# Patient Record
Sex: Female | Born: 1940 | Race: White | Hispanic: No | Smoking: Never smoker
Health system: Southern US, Community
[De-identification: ages and names within clinical notes are randomized; demographics above are authoritative.]

## PROBLEM LIST (undated history)

## (undated) DIAGNOSIS — F32A Depression, unspecified: Secondary | ICD-10-CM

## (undated) DIAGNOSIS — I499 Cardiac arrhythmia, unspecified: Secondary | ICD-10-CM

## (undated) DIAGNOSIS — I10 Essential (primary) hypertension: Secondary | ICD-10-CM

## (undated) DIAGNOSIS — F329 Major depressive disorder, single episode, unspecified: Secondary | ICD-10-CM

## (undated) DIAGNOSIS — M797 Fibromyalgia: Secondary | ICD-10-CM

## (undated) DIAGNOSIS — E039 Hypothyroidism, unspecified: Secondary | ICD-10-CM

## (undated) HISTORY — PX: REPLACEMENT TOTAL KNEE: SUR1224

## (undated) HISTORY — PX: BLEPHAROPLASTY: SUR158

## (undated) HISTORY — PX: ABDOMINAL HYSTERECTOMY: SHX81

## (undated) HISTORY — PX: CATARACT EXTRACTION W/ INTRAOCULAR LENS & ANTERIOR VITRECTOMY: SHX1308

## (undated) HISTORY — PX: TONSILLECTOMY: SUR1361

---
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
## 2018-11-02 ENCOUNTER — Other Ambulatory Visit: Payer: Self-pay | Admitting: Student

## 2018-11-02 DIAGNOSIS — M19012 Primary osteoarthritis, left shoulder: Secondary | ICD-10-CM

## 2018-11-07 ENCOUNTER — Other Ambulatory Visit: Payer: Self-pay

## 2018-11-07 ENCOUNTER — Ambulatory Visit
Admission: RE | Admit: 2018-11-07 | Discharge: 2018-11-07 | Disposition: A | Discharge status: Home / Self Care | Payer: Medicare HMO | Source: Ambulatory Visit | Attending: Student | Admitting: Student

## 2018-11-07 DIAGNOSIS — M19012 Primary osteoarthritis, left shoulder: Secondary | ICD-10-CM

## 2018-12-25 ENCOUNTER — Other Ambulatory Visit: Payer: Self-pay

## 2018-12-25 ENCOUNTER — Encounter
Admission: RE | Admit: 2018-12-25 | Discharge: 2018-12-25 | Disposition: A | Discharge status: Home / Self Care | Payer: Medicare HMO | Source: Ambulatory Visit | Attending: Surgery | Admitting: Surgery

## 2018-12-25 DIAGNOSIS — E785 Hyperlipidemia, unspecified: Secondary | ICD-10-CM | POA: Diagnosis not present

## 2018-12-25 DIAGNOSIS — R9431 Abnormal electrocardiogram [ECG] [EKG]: Secondary | ICD-10-CM | POA: Diagnosis not present

## 2018-12-25 DIAGNOSIS — Z01810 Encounter for preprocedural cardiovascular examination: Secondary | ICD-10-CM

## 2018-12-25 DIAGNOSIS — Z01818 Encounter for other preprocedural examination: Secondary | ICD-10-CM | POA: Insufficient documentation

## 2018-12-25 DIAGNOSIS — I491 Atrial premature depolarization: Secondary | ICD-10-CM | POA: Diagnosis not present

## 2018-12-25 HISTORY — DX: Hypothyroidism, unspecified: E03.9

## 2018-12-25 HISTORY — DX: Depression, unspecified: F32.A

## 2018-12-25 HISTORY — DX: Essential (primary) hypertension: I10

## 2018-12-25 HISTORY — DX: Fibromyalgia: M79.7

## 2018-12-25 HISTORY — DX: Cardiac arrhythmia, unspecified: I49.9

## 2018-12-25 LAB — PROTIME-INR
INR: 1.1 (ref 0.8–1.2)
Prothrombin Time: 13.6 seconds (ref 11.4–15.2)

## 2018-12-25 LAB — TYPE AND SCREEN
ABO/RH(D): O POS
Antibody Screen: NEGATIVE

## 2018-12-25 LAB — SURGICAL PCR SCREEN
MRSA, PCR: NEGATIVE
Staphylococcus aureus: NEGATIVE

## 2018-12-25 LAB — BASIC METABOLIC PANEL
Anion gap: 9 (ref 5–15)
BUN: 14 mg/dL (ref 8–23)
CO2: 29 mmol/L (ref 22–32)
Calcium: 9.1 mg/dL (ref 8.9–10.3)
Chloride: 99 mmol/L (ref 98–111)
Creatinine, Ser: 0.72 mg/dL (ref 0.44–1.00)
GFR calc Af Amer: >60 mL/min (ref >60)
GFR calc non Af Amer: >60 mL/min (ref >60)
Glucose, Bld: 113 mg/dL — ABNORMAL HIGH (ref 70–99)
Potassium: 3.8 mmol/L (ref 3.5–5.1)
Sodium: 137 mmol/L (ref 135–145)

## 2018-12-25 LAB — CBC
HCT: 45.2 % (ref 36.0–46.0)
Hemoglobin: 15.3 g/dL — ABNORMAL HIGH (ref 12.0–15.0)
MCH: 31 pg (ref 26.0–34.0)
MCHC: 33.8 g/dL (ref 30.0–36.0)
MCV: 91.7 fL (ref 80.0–100.0)
Platelets: 272 10*3/uL (ref 150–400)
RBC: 4.93 MIL/uL (ref 3.87–5.11)
RDW: 12.1 % (ref 11.5–15.5)
WBC: 10.9 10*3/uL — ABNORMAL HIGH (ref 4.0–10.5)
nRBC: 0 % (ref 0.0–0.2)

## 2018-12-25 LAB — APTT: aPTT: 32 seconds (ref 24–36)

## 2018-12-25 LAB — SEDIMENTATION RATE: Sed Rate: 6 mm/hr (ref 0–30)

## 2018-12-25 NOTE — Patient Instructions (Addendum)
Your procedure is scheduled on: 01/03/2019 Thurs Report to Same Day Surgery 2nd floor medical mall [facility] Entrance-take elevator on left to 2nd floor.  Check in with surgery information desk.) To find out your arrival time please call [phone] between 1PM - 3PM on 01/02/2019 Wed  Remember: Instructions that are not followed completely may result in serious medical risk, up to and including death, or upon the discretion of your surgeon and anesthesiologist your surgery may need to be rescheduled.    _x___ 1. Do not eat food after midnight the night before your procedure. You may drink clear liquids up to 2 hours before you are scheduled to arrive at the hospital for your procedure.  Do not drink clear liquids within 2 hours of your scheduled arrival to the hospital.  Clear liquids include  --Water or Apple juice without pulp  --Clear carbohydrate beverage such as ClearFast or Gatorade  --Black Coffee or Clear Tea (No milk, no creamers, do not add anything to                  the coffee or Tea Type 1 and type 2 diabetics should only drink water.   ____Ensure clear carbohydrate drink on the way to the hospital for bariatric patients  ____Ensure clear carbohydrate drink 3 hours before surgery.   No gum chewing or hard candies.     __x__ 2. No Alcohol for 24 hours before or after surgery.   __x__3. No Smoking or e-cigarettes for 24 prior to surgery.  Do not use any chewable tobacco products for at least 6 hour prior to surgery   ____  4. Bring all medications with you on the day of surgery if instructed.    __x__ 5. Notify your doctor if there is any change in your medical condition     (cold, fever, infections).    x___6. On the morning of surgery brush your teeth with toothpaste and water.  You may rinse your mouth with mouth wash if you wish.  Do not swallow any toothpaste or mouthwash.   Do not wear jewelry, make-up, hairpins, clips or nail polish.  Do not wear lotions,  powders, or perfumes. You may wear deodorant.  Do not shave 48 hours prior to surgery. Men may shave face and neck.  Do not bring valuables to the hospital.    [facility] is not responsible for any belongings or valuables.               Contacts, dentures or bridgework may not be worn into surgery.  Leave your suitcase in the car. After surgery it may be brought to your room.  For patients admitted to the hospital, discharge time is determined by your                       treatment team.  _  Patients discharged the day of surgery will not be allowed to drive home.  You will need someone to drive you home and stay with you the night of your procedure.    Please read over the following fact sheets that you were given:   [facility] Preparing for Surgery and or MRSA Information   _x___ Take anti-hypertensive listed below, cardiac, seizure, asthma,     anti-reflux and psychiatric medicines. These include:  1. levothyroxine (SYNTHROID) 137 MCG tablet  2.metoprolol tartrate (LOPRESSOR) 25 MG tablet  3.atorvastatin (LIPITOR) 20 MG tablet  4.  5.  6.  ____Fleets  enema or Magnesium Citrate as directed.   _x___ Use CHG Soap or sage wipes as directed on instruction sheet   ____ Use inhalers on the day of surgery and bring to hospital day of surgery  ____ Stop Metformin and Janumet 2 days prior to surgery.    ____ Take 1/2 of usual insulin dose the night before surgery and none on the morning     surgery.   _x___ Follow recommendations from Cardiologist, Pulmonologist or PCP regarding          stopping Aspirin, Coumadin, Plavix ,Eliquis, Effient, or Pradaxa, and Pletal.  X____Stop Anti-inflammatories such as Advil, Aleve, Ibuprofen, Motrin, Naproxen, Naprosyn, Goodies powders or aspirin products. OK to take Tylenol and Celebrex.   _x___ Stop supplements until after surgery.  But may continue Vitamin D, Vitamin B,       and multivitamin. Stop vitamin E and krill oil on Thurs  ____ Bring  C-Pap to the hospital.

## 2018-12-27 ENCOUNTER — Other Ambulatory Visit: Payer: Medicare HMO

## 2018-12-27 ENCOUNTER — Inpatient Hospital Stay: Admission: RE | Admit: 2018-12-27 | Payer: Medicare HMO | Source: Ambulatory Visit

## 2018-12-31 ENCOUNTER — Other Ambulatory Visit
Admission: RE | Admit: 2018-12-31 | Discharge: 2018-12-31 | Disposition: A | Discharge status: Home / Self Care | Payer: Medicare HMO | Source: Ambulatory Visit | Attending: Surgery | Admitting: Surgery

## 2018-12-31 ENCOUNTER — Other Ambulatory Visit: Payer: Self-pay

## 2018-12-31 DIAGNOSIS — Z20828 Contact with and (suspected) exposure to other viral communicable diseases: Secondary | ICD-10-CM | POA: Insufficient documentation

## 2018-12-31 DIAGNOSIS — Z01812 Encounter for preprocedural laboratory examination: Secondary | ICD-10-CM | POA: Diagnosis present

## 2018-12-31 LAB — URINALYSIS, COMPLETE (UACMP) WITH MICROSCOPIC
Bacteria, UA: NONE SEEN
Bilirubin Urine: NEGATIVE
Glucose, UA: NEGATIVE mg/dL
Ketones, ur: NEGATIVE mg/dL
Leukocytes,Ua: NEGATIVE
Nitrite: NEGATIVE
Protein, ur: NEGATIVE mg/dL
Specific Gravity, Urine: 1.008 (ref 1.005–1.030)
Squamous Epithelial / HPF: NONE SEEN (ref 0–5)
pH: 7 (ref 5.0–8.0)

## 2018-12-31 LAB — SARS CORONAVIRUS 2 (TAT 6-24 HRS): SARS Coronavirus 2: NEGATIVE

## 2019-01-01 LAB — URINE CULTURE: Culture: NO GROWTH

## 2019-01-03 ENCOUNTER — Inpatient Hospital Stay
Admission: RE | Admit: 2019-01-03 | Discharge: 2019-01-05 | DRG: 483 | Disposition: A | Discharge status: Home Health | Payer: Medicare HMO | Source: Ambulatory Visit | Attending: Surgery | Admitting: Surgery

## 2019-01-03 ENCOUNTER — Inpatient Hospital Stay: Payer: Medicare HMO

## 2019-01-03 ENCOUNTER — Inpatient Hospital Stay: Payer: Medicare HMO | Admitting: Anesthesiology

## 2019-01-03 ENCOUNTER — Encounter
Admission: RE | Disposition: A | Discharge status: Home Health | Payer: Self-pay | Source: Ambulatory Visit | Attending: Surgery

## 2019-01-03 ENCOUNTER — Other Ambulatory Visit: Payer: Self-pay

## 2019-01-03 DIAGNOSIS — M797 Fibromyalgia: Secondary | ICD-10-CM | POA: Diagnosis present

## 2019-01-03 DIAGNOSIS — F329 Major depressive disorder, single episode, unspecified: Secondary | ICD-10-CM | POA: Diagnosis present

## 2019-01-03 DIAGNOSIS — E039 Hypothyroidism, unspecified: Secondary | ICD-10-CM | POA: Diagnosis present

## 2019-01-03 DIAGNOSIS — Z885 Allergy status to narcotic agent status: Secondary | ICD-10-CM | POA: Diagnosis not present

## 2019-01-03 DIAGNOSIS — Z9049 Acquired absence of other specified parts of digestive tract: Secondary | ICD-10-CM | POA: Diagnosis not present

## 2019-01-03 DIAGNOSIS — M19012 Primary osteoarthritis, left shoulder: Secondary | ICD-10-CM | POA: Diagnosis present

## 2019-01-03 DIAGNOSIS — I10 Essential (primary) hypertension: Secondary | ICD-10-CM | POA: Diagnosis present

## 2019-01-03 DIAGNOSIS — Z20828 Contact with and (suspected) exposure to other viral communicable diseases: Secondary | ICD-10-CM | POA: Diagnosis present

## 2019-01-03 DIAGNOSIS — Z96612 Presence of left artificial shoulder joint: Secondary | ICD-10-CM

## 2019-01-03 HISTORY — PX: REVERSE SHOULDER ARTHROPLASTY: SHX5054

## 2019-01-03 LAB — ABO/RH: ABO/RH(D): O POS

## 2019-01-03 SURGERY — ARTHROPLASTY, SHOULDER, TOTAL, REVERSE
Anesthesia: General | Site: Shoulder | Laterality: Left

## 2019-01-03 MED ORDER — DEXAMETHASONE SODIUM PHOSPHATE 10 MG/ML IJ SOLN
INTRAMUSCULAR | Status: AC
  Filled 2019-01-03: qty 1

## 2019-01-03 MED ORDER — ROCURONIUM BROMIDE 50 MG/5ML IV SOLN
INTRAVENOUS | Status: AC
  Filled 2019-01-03: qty 1

## 2019-01-03 MED ORDER — VITAMIN E 180 MG (400 UNIT) PO CAPS
400.0000 [IU] | ORAL_CAPSULE | Freq: Every day | ORAL | Status: DC
  Administered 2019-01-04 – 2019-01-05 (×2): 400 [IU] via ORAL
  Filled 2019-01-03 (×3): qty 1

## 2019-01-03 MED ORDER — SUGAMMADEX SODIUM 200 MG/2ML IV SOLN
INTRAVENOUS | Status: AC
  Filled 2019-01-03: qty 2

## 2019-01-03 MED ORDER — GLYCOPYRROLATE 0.2 MG/ML IJ SOLN
INTRAMUSCULAR | Status: AC
  Filled 2019-01-03: qty 1

## 2019-01-03 MED ORDER — SODIUM CHLORIDE 0.9 % IV SOLN
INTRAVENOUS | Status: DC | PRN
  Administered 2019-01-03: 30 ug/min via INTRAVENOUS

## 2019-01-03 MED ORDER — PROPOFOL 10 MG/ML IV BOLUS
INTRAVENOUS | Status: AC
  Filled 2019-01-03: qty 20

## 2019-01-03 MED ORDER — BUPIVACAINE LIPOSOME 1.3 % IJ SUSP
INTRAMUSCULAR | Status: AC
  Filled 2019-01-03: qty 20

## 2019-01-03 MED ORDER — METOCLOPRAMIDE HCL 5 MG/ML IJ SOLN: 5.0000 mg | Freq: Three times a day (TID) | INTRAMUSCULAR | Status: DC | PRN

## 2019-01-03 MED ORDER — SODIUM CHLORIDE 0.9 % IV SOLN: INTRAVENOUS | Status: DC

## 2019-01-03 MED ORDER — LEVOTHYROXINE SODIUM 137 MCG PO TABS
137.0000 ug | ORAL_TABLET | Freq: Every day | ORAL | Status: DC
  Administered 2019-01-04 – 2019-01-05 (×2): 137 ug via ORAL
  Filled 2019-01-03 (×2): qty 1

## 2019-01-03 MED ORDER — OMEGA-3-ACID ETHYL ESTERS 1 G PO CAPS
1000.0000 mg | ORAL_CAPSULE | Freq: Every day | ORAL | Status: DC
  Administered 2019-01-03 – 2019-01-05 (×3): 1000 mg via ORAL
  Filled 2019-01-03 (×3): qty 1

## 2019-01-03 MED ORDER — BIOTIN W/ VITAMINS C & E 1250-7.5-7.5 MCG-MG-UNT PO CHEW: CHEWABLE_TABLET | Freq: Every day | ORAL | Status: DC

## 2019-01-03 MED ORDER — PHENYLEPHRINE HCL (PRESSORS) 10 MG/ML IV SOLN
INTRAVENOUS | Status: AC
  Filled 2019-01-03: qty 1

## 2019-01-03 MED ORDER — CEFAZOLIN SODIUM-DEXTROSE 2-4 GM/100ML-% IV SOLN
INTRAVENOUS | Status: AC
  Filled 2019-01-03: qty 100

## 2019-01-03 MED ORDER — ACETAMINOPHEN 325 MG PO TABS: 325.0000 mg | ORAL_TABLET | Freq: Four times a day (QID) | ORAL | Status: DC | PRN

## 2019-01-03 MED ORDER — BUPIVACAINE HCL (PF) 0.5 % IJ SOLN
INTRAMUSCULAR | Status: DC | PRN
  Administered 2019-01-03: 10 mL

## 2019-01-03 MED ORDER — PROPOFOL 10 MG/ML IV BOLUS
INTRAVENOUS | Status: DC | PRN
  Administered 2019-01-03: 100 mg via INTRAVENOUS
  Administered 2019-01-03: 50 mg via INTRAVENOUS
  Administered 2019-01-03: 20 mg via INTRAVENOUS

## 2019-01-03 MED ORDER — BUPIVACAINE-EPINEPHRINE (PF) 0.5% -1:200000 IJ SOLN
INTRAMUSCULAR | Status: DC | PRN
  Administered 2019-01-03: 30 mL

## 2019-01-03 MED ORDER — TRANEXAMIC ACID 1000 MG/10ML IV SOLN
INTRAVENOUS | Status: AC
  Filled 2019-01-03: qty 10

## 2019-01-03 MED ORDER — CEFAZOLIN SODIUM-DEXTROSE 2-4 GM/100ML-% IV SOLN
2.0000 g | Freq: Once | INTRAVENOUS | Status: AC
  Administered 2019-01-03: 2 g via INTRAVENOUS

## 2019-01-03 MED ORDER — ROCURONIUM BROMIDE 100 MG/10ML IV SOLN
INTRAVENOUS | Status: DC | PRN
  Administered 2019-01-03: 50 mg via INTRAVENOUS
  Administered 2019-01-03 (×2): 10 mg via INTRAVENOUS

## 2019-01-03 MED ORDER — HYDROMORPHONE HCL 1 MG/ML IJ SOLN: 0.2500 mg | INTRAMUSCULAR | Status: DC | PRN

## 2019-01-03 MED ORDER — FLEET ENEMA 7-19 GM/118ML RE ENEM: 1.0000 | ENEMA | Freq: Once | RECTAL | Status: DC | PRN

## 2019-01-03 MED ORDER — DIPHENHYDRAMINE HCL 12.5 MG/5ML PO ELIX
12.5000 mg | ORAL_SOLUTION | ORAL | Status: DC | PRN
  Administered 2019-01-04: 25 mg via ORAL
  Filled 2019-01-03: qty 10

## 2019-01-03 MED ORDER — DULOXETINE HCL 60 MG PO CPEP
60.0000 mg | ORAL_CAPSULE | Freq: Every day | ORAL | Status: DC
  Administered 2019-01-03 – 2019-01-04 (×2): 60 mg via ORAL
  Filled 2019-01-03 (×3): qty 1

## 2019-01-03 MED ORDER — MIDAZOLAM HCL 2 MG/2ML IJ SOLN
INTRAMUSCULAR | Status: AC
  Administered 2019-01-03: 1 mg via INTRAVENOUS
  Filled 2019-01-03: qty 2

## 2019-01-03 MED ORDER — ONDANSETRON HCL 4 MG/2ML IJ SOLN
INTRAMUSCULAR | Status: DC | PRN
  Administered 2019-01-03: 4 mg via INTRAVENOUS

## 2019-01-03 MED ORDER — LIDOCAINE HCL (PF) 2 % IJ SOLN
INTRAMUSCULAR | Status: AC
  Filled 2019-01-03: qty 10

## 2019-01-03 MED ORDER — ATORVASTATIN CALCIUM 20 MG PO TABS
20.0000 mg | ORAL_TABLET | Freq: Every day | ORAL | Status: DC
  Administered 2019-01-04 – 2019-01-05 (×2): 20 mg via ORAL
  Filled 2019-01-03 (×2): qty 1

## 2019-01-03 MED ORDER — ACETAMINOPHEN 160 MG/5ML PO SOLN
325.0000 mg | ORAL | Status: DC | PRN
  Filled 2019-01-03: qty 20.3

## 2019-01-03 MED ORDER — FENTANYL CITRATE (PF) 100 MCG/2ML IJ SOLN
INTRAMUSCULAR | Status: AC
  Administered 2019-01-03: 09:00:00 50 ug via INTRAVENOUS
  Filled 2019-01-03: qty 2

## 2019-01-03 MED ORDER — METOPROLOL TARTRATE 25 MG PO TABS
25.0000 mg | ORAL_TABLET | Freq: Two times a day (BID) | ORAL | Status: DC
  Administered 2019-01-03 – 2019-01-05 (×4): 25 mg via ORAL
  Filled 2019-01-03 (×4): qty 1

## 2019-01-03 MED ORDER — FENTANYL CITRATE (PF) 100 MCG/2ML IJ SOLN
50.0000 ug | Freq: Once | INTRAMUSCULAR | Status: AC
  Administered 2019-01-03: 50 ug via INTRAVENOUS

## 2019-01-03 MED ORDER — APIXABAN 5 MG PO TABS
5.0000 mg | ORAL_TABLET | Freq: Two times a day (BID) | ORAL | Status: DC
  Administered 2019-01-04 – 2019-01-05 (×3): 5 mg via ORAL
  Filled 2019-01-03 (×3): qty 1

## 2019-01-03 MED ORDER — MIDAZOLAM HCL 2 MG/2ML IJ SOLN
1.0000 mg | Freq: Once | INTRAMUSCULAR | Status: AC
  Administered 2019-01-03: 1 mg via INTRAVENOUS

## 2019-01-03 MED ORDER — FENTANYL CITRATE (PF) 100 MCG/2ML IJ SOLN
INTRAMUSCULAR | Status: DC | PRN
  Administered 2019-01-03 (×2): 50 ug via INTRAVENOUS

## 2019-01-03 MED ORDER — ONDANSETRON HCL 4 MG PO TABS: 4.0000 mg | ORAL_TABLET | Freq: Four times a day (QID) | ORAL | Status: DC | PRN

## 2019-01-03 MED ORDER — BUPIVACAINE-EPINEPHRINE (PF) 0.5% -1:200000 IJ SOLN
INTRAMUSCULAR | Status: AC
  Filled 2019-01-03: qty 30

## 2019-01-03 MED ORDER — ACETAMINOPHEN 325 MG PO TABS: 325.0000 mg | ORAL_TABLET | ORAL | Status: DC | PRN

## 2019-01-03 MED ORDER — MAGNESIUM HYDROXIDE 400 MG/5ML PO SUSP: 30.0000 mL | Freq: Every day | ORAL | Status: DC | PRN

## 2019-01-03 MED ORDER — TRAMADOL HCL 50 MG PO TABS
50.0000 mg | ORAL_TABLET | Freq: Four times a day (QID) | ORAL | Status: DC | PRN
  Administered 2019-01-03 – 2019-01-05 (×3): 50 mg via ORAL
  Filled 2019-01-03 (×3): qty 1

## 2019-01-03 MED ORDER — METOCLOPRAMIDE HCL 10 MG PO TABS: 5.0000 mg | ORAL_TABLET | Freq: Three times a day (TID) | ORAL | Status: DC | PRN

## 2019-01-03 MED ORDER — SODIUM CHLORIDE FLUSH 0.9 % IV SOLN
INTRAVENOUS | Status: AC
  Filled 2019-01-03: qty 40

## 2019-01-03 MED ORDER — FENTANYL CITRATE (PF) 100 MCG/2ML IJ SOLN: 25.0000 ug | INTRAMUSCULAR | Status: DC | PRN

## 2019-01-03 MED ORDER — DOCUSATE SODIUM 100 MG PO CAPS
100.0000 mg | ORAL_CAPSULE | Freq: Two times a day (BID) | ORAL | Status: DC
  Administered 2019-01-03 – 2019-01-05 (×4): 100 mg via ORAL
  Filled 2019-01-03 (×4): qty 1

## 2019-01-03 MED ORDER — EPHEDRINE SULFATE 50 MG/ML IJ SOLN
INTRAMUSCULAR | Status: AC
  Filled 2019-01-03: qty 1

## 2019-01-03 MED ORDER — PHENYLEPHRINE HCL (PRESSORS) 10 MG/ML IV SOLN
INTRAVENOUS | Status: DC | PRN
  Administered 2019-01-03: 100 ug via INTRAVENOUS
  Administered 2019-01-03: 150 ug via INTRAVENOUS

## 2019-01-03 MED ORDER — FENTANYL CITRATE (PF) 100 MCG/2ML IJ SOLN
INTRAMUSCULAR | Status: AC
  Filled 2019-01-03: qty 2

## 2019-01-03 MED ORDER — BUPIVACAINE HCL (PF) 0.5 % IJ SOLN
INTRAMUSCULAR | Status: AC
  Filled 2019-01-03: qty 10

## 2019-01-03 MED ORDER — LIDOCAINE HCL (CARDIAC) PF 100 MG/5ML IV SOSY
PREFILLED_SYRINGE | INTRAVENOUS | Status: DC | PRN
  Administered 2019-01-03: 100 mg via INTRAVENOUS

## 2019-01-03 MED ORDER — PROMETHAZINE HCL 25 MG/ML IJ SOLN: 6.2500 mg | INTRAMUSCULAR | Status: DC | PRN

## 2019-01-03 MED ORDER — TRANEXAMIC ACID 1000 MG/10ML IV SOLN
INTRAVENOUS | Status: DC | PRN
  Administered 2019-01-03: 1000 mg via TOPICAL

## 2019-01-03 MED ORDER — LIDOCAINE HCL (PF) 1 % IJ SOLN
INTRAMUSCULAR | Status: AC
  Filled 2019-01-03: qty 5

## 2019-01-03 MED ORDER — CEFAZOLIN SODIUM-DEXTROSE 2-4 GM/100ML-% IV SOLN
2.0000 g | Freq: Four times a day (QID) | INTRAVENOUS | Status: AC
  Administered 2019-01-03 – 2019-01-04 (×3): 2 g via INTRAVENOUS
  Filled 2019-01-03 (×3): qty 100

## 2019-01-03 MED ORDER — FAMOTIDINE 20 MG PO TABS
20.0000 mg | ORAL_TABLET | Freq: Once | ORAL | Status: AC
  Administered 2019-01-03: 20 mg via ORAL

## 2019-01-03 MED ORDER — DEXAMETHASONE SODIUM PHOSPHATE 10 MG/ML IJ SOLN
INTRAMUSCULAR | Status: DC | PRN
  Administered 2019-01-03: 10 mg via INTRAVENOUS

## 2019-01-03 MED ORDER — ONDANSETRON HCL 4 MG/2ML IJ SOLN: 4.0000 mg | Freq: Four times a day (QID) | INTRAMUSCULAR | Status: DC | PRN

## 2019-01-03 MED ORDER — FAMOTIDINE 20 MG PO TABS
ORAL_TABLET | ORAL | Status: AC
  Filled 2019-01-03: qty 1

## 2019-01-03 MED ORDER — OXYCODONE HCL 5 MG PO TABS
5.0000 mg | ORAL_TABLET | ORAL | Status: DC | PRN
  Administered 2019-01-04: 5 mg via ORAL
  Administered 2019-01-05 (×2): 10 mg via ORAL
  Filled 2019-01-03: qty 1
  Filled 2019-01-03 (×2): qty 2

## 2019-01-03 MED ORDER — LACTATED RINGERS IV SOLN
INTRAVENOUS | Status: DC
  Administered 2019-01-03 (×2): via INTRAVENOUS

## 2019-01-03 MED ORDER — BUPIVACAINE LIPOSOME 1.3 % IJ SUSP
INTRAMUSCULAR | Status: DC | PRN
  Administered 2019-01-03: 20 mL

## 2019-01-03 MED ORDER — EPHEDRINE SULFATE 50 MG/ML IJ SOLN
INTRAMUSCULAR | Status: DC | PRN
  Administered 2019-01-03 (×3): 5 mg via INTRAVENOUS

## 2019-01-03 MED ORDER — MEPERIDINE HCL 50 MG/ML IJ SOLN: 6.2500 mg | INTRAMUSCULAR | Status: DC | PRN

## 2019-01-03 MED ORDER — GLYCOPYRROLATE 0.2 MG/ML IJ SOLN
INTRAMUSCULAR | Status: DC | PRN
  Administered 2019-01-03: 0.1 mg via INTRAVENOUS

## 2019-01-03 MED ORDER — BISACODYL 10 MG RE SUPP
10.0000 mg | Freq: Every day | RECTAL | Status: DC | PRN
  Administered 2019-01-05: 10 mg via RECTAL
  Filled 2019-01-03: qty 1

## 2019-01-03 MED ORDER — SUGAMMADEX SODIUM 200 MG/2ML IV SOLN
INTRAVENOUS | Status: DC | PRN
  Administered 2019-01-03: 200 mg via INTRAVENOUS

## 2019-01-03 MED ORDER — ONDANSETRON HCL 4 MG/2ML IJ SOLN
INTRAMUSCULAR | Status: AC
  Filled 2019-01-03: qty 2

## 2019-01-03 MED ORDER — ACETAMINOPHEN 500 MG PO TABS
1000.0000 mg | ORAL_TABLET | Freq: Four times a day (QID) | ORAL | Status: AC
  Administered 2019-01-03 – 2019-01-04 (×4): 1000 mg via ORAL
  Filled 2019-01-03 (×4): qty 2

## 2019-01-03 SURGICAL SUPPLY — 71 items
BASEPLATE AUG MED W-TAPER (Plate) ×3 IMPLANT
BEARING HUMERAL SHLDER 36M STD (Shoulder) ×1 IMPLANT
BIT DRILL 2.7 W/STOP DISP (BIT) ×2 IMPLANT
BIT DRILL 2.7MM W/STOP DISP (BIT) ×1
BIT DRILL TWIST 2.7 (BIT) ×2 IMPLANT
BIT DRILL TWIST 2.7MM (BIT) ×1
BLADE SAW SAG 25X90X1.19 (BLADE) ×3 IMPLANT
CANISTER SUCT 1200ML W/VALVE (MISCELLANEOUS) ×3 IMPLANT
CANISTER SUCT 3000ML PPV (MISCELLANEOUS) ×6 IMPLANT
CHLORAPREP W/TINT 26 (MISCELLANEOUS) ×3 IMPLANT
COOLER POLAR GLACIER W/PUMP (MISCELLANEOUS) ×3 IMPLANT
COVER BACK TABLE REUSABLE LG (DRAPES) ×3 IMPLANT
COVER WAND RF STERILE (DRAPES) ×3 IMPLANT
CRADLE LAMINECT ARM (MISCELLANEOUS) ×3 IMPLANT
DRAPE 3/4 80X56 (DRAPES) ×6 IMPLANT
DRAPE INCISE IOBAN 66X45 STRL (DRAPES) ×6 IMPLANT
DRAPE SPLIT 6X30 W/TAPE (DRAPES) ×6 IMPLANT
DRSG OPSITE POSTOP 4X8 (GAUZE/BANDAGES/DRESSINGS) ×3 IMPLANT
ELECT BLADE 6.5 EXT (BLADE) IMPLANT
ELECT CAUTERY BLADE 6.4 (BLADE) ×3 IMPLANT
GLENOID SPHERE STD STRL 36MM (Orthopedic Implant) ×3 IMPLANT
GLOVE BIO SURGEON STRL SZ7.5 (GLOVE) ×15 IMPLANT
GLOVE BIO SURGEON STRL SZ8 (GLOVE) ×21 IMPLANT
GLOVE BIOGEL PI IND STRL 8 (GLOVE) ×1 IMPLANT
GLOVE BIOGEL PI INDICATOR 8 (GLOVE) ×2
GLOVE INDICATOR 8.0 STRL GRN (GLOVE) ×3 IMPLANT
GOWN STRL REUS W/ TWL LRG LVL3 (GOWN DISPOSABLE) ×3 IMPLANT
GOWN STRL REUS W/ TWL XL LVL3 (GOWN DISPOSABLE) ×1 IMPLANT
GOWN STRL REUS W/TWL LRG LVL3 (GOWN DISPOSABLE) ×6
GOWN STRL REUS W/TWL XL LVL3 (GOWN DISPOSABLE) ×2
HOOD PEEL AWAY FLYTE STAYCOOL (MISCELLANEOUS) ×12 IMPLANT
KIT STABILIZATION SHOULDER (MISCELLANEOUS) ×3 IMPLANT
KIT TURNOVER KIT A (KITS) ×3 IMPLANT
MASK FACE SPIDER DISP (MASK) ×3 IMPLANT
MAT ABSORB  FLUID 56X50 GRAY (MISCELLANEOUS) ×2
MAT ABSORB FLUID 56X50 GRAY (MISCELLANEOUS) ×1 IMPLANT
NDL SAFETY ECLIPSE 18X1.5 (NEEDLE) ×1 IMPLANT
NEEDLE HYPO 18GX1.5 SHARP (NEEDLE) ×2
NEEDLE HYPO 22GX1.5 SAFETY (NEEDLE) ×3 IMPLANT
NEEDLE SPNL 20GX3.5 QUINCKE YW (NEEDLE) ×3 IMPLANT
NS IRRIG 500ML POUR BTL (IV SOLUTION) ×3 IMPLANT
PACK ARTHROSCOPY SHOULDER (MISCELLANEOUS) ×3 IMPLANT
PAD WRAPON POLAR SHDR UNIV (MISCELLANEOUS) ×1 IMPLANT
PIN HUMERAL STMN 3.2MMX9IN (INSTRUMENTS) ×3 IMPLANT
PULSAVAC PLUS IRRIG FAN TIP (DISPOSABLE) ×3
REAMER GUIDE BUSHING SURG DISP (MISCELLANEOUS) ×3 IMPLANT
REAMER GUIDE W/SCREW AUG (MISCELLANEOUS) ×3 IMPLANT
SCREW BONE STRL 6.5MMX30MM (Screw) ×3 IMPLANT
SCREW LOCKING NS 4.75MMX20MM (Screw) ×3 IMPLANT
SCREW LOCKING STRL 4.75X25X3.5 (Screw) ×3 IMPLANT
SCREW NON-LOCK 4.75MMX15MM (Screw) ×3 IMPLANT
SCREW NON-LOCK 4.75MMX25MMX3.5 (Screw) ×3 IMPLANT
SHOULDER HUMERAL BEAR 36M STD (Shoulder) ×3 IMPLANT
SLING ULTRA II M (MISCELLANEOUS) ×3 IMPLANT
SOL .9 NS 3000ML IRR  AL (IV SOLUTION) ×2
SOL .9 NS 3000ML IRR UROMATIC (IV SOLUTION) ×1 IMPLANT
SPONGE LAP 18X18 RF (DISPOSABLE) ×3 IMPLANT
STAPLER SKIN PROX 35W (STAPLE) ×3 IMPLANT
STEM HUMERAL STRL 11MMX55MM (Stem) ×3 IMPLANT
SUT ETHIBOND 0 MO6 C/R (SUTURE) ×3 IMPLANT
SUT FIBERWIRE #2 38 BLUE 1/2 (SUTURE) ×12
SUT VIC AB 0 CT1 36 (SUTURE) ×3 IMPLANT
SUT VIC AB 2-0 CT1 (SUTURE) ×3 IMPLANT
SUT VIC AB 2-0 CT1 27 (SUTURE) ×4
SUT VIC AB 2-0 CT1 TAPERPNT 27 (SUTURE) ×2 IMPLANT
SUTURE FIBERWR #2 38 BLUE 1/2 (SUTURE) ×4 IMPLANT
SYR 10ML LL (SYRINGE) ×3 IMPLANT
SYR 30ML LL (SYRINGE) IMPLANT
TIP FAN IRRIG PULSAVAC PLUS (DISPOSABLE) ×1 IMPLANT
TRAY HUM MINI SHOULDER +0 40D (Shoulder) ×3 IMPLANT
WRAPON POLAR PAD SHDR UNIV (MISCELLANEOUS) ×3

## 2019-01-03 NOTE — Transfer of Care (Signed)
Immediate Anesthesia Transfer of Care Note  Patient: Julie Wade  Procedure(s) Performed: REVERSE SHOULDER ARTHROPLASTY - RNFA (Left Shoulder)  Patient Location: PACU  Anesthesia Type:General  Level of Consciousness: awake  Airway & Oxygen Therapy: Patient Spontanous Breathing and Patient connected to nasal cannula oxygen  Post-op Assessment: Report given to RN and Post -op Vital signs reviewed and stable  Post vital signs: stable  Last Vitals:  Vitals Value Taken Time  BP 153/73 01/03/19 1357  Temp 36.4 C 01/03/19 1357  Pulse 79 01/03/19 1402  Resp 16 01/03/19 1402  SpO2 98 % 01/03/19 1402  Vitals shown include unvalidated device data.  Last Pain:  Vitals:   01/03/19 1357  TempSrc:   PainSc: (P) 0-No pain         Complications: No apparent anesthesia complications

## 2019-01-03 NOTE — Progress Notes (Addendum)
[place], [phone], which is the patient's contact. No answer. Voice mailbox full. Patient states she has cell phone and will contact him once in her inpatient room.

## 2019-01-03 NOTE — Op Note (Signed)
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  Post-Op Diagnosis:   Same  Procedure:   Reverse left total shoulder arthroplasty.  Surgeon:   Maryagnes AmosJ. Jeffrey Guillermo Difrancesco, MD  Assistant:   Horris LatinoLance McGhee, PA-C; Lindell SparHailey Einck, PA-S  Anesthesia:   General endotracheal with an interscalene block using Exparel placed preoperatively by the anesthesiologist.  Findings:   As above.  Complications:   None  EBL:    200 cc  Fluids:   1000 cc crystalloid  UOP:   None  TT:   None  Drains:   None  Closure:   Staples  Implants:   All press-fit Biomet Comprehensive system with a #11 micro-humeral stem, a 40 mm humeral tray with a standard insert, and a medium augmented mini-base plate with a 36 mm glenosphere.  Brief Clinical Note:   The patient is a 78 year old female with a long history of gradually worsening left shoulder pain. Her symptoms have progressed despite medications, activity modification, etc. Her history and examination consistent with advanced degenerative joint disease as confirmed on plain radiographs. An MRI scan demonstrated moderate tendinopathy with partial-thickness tearing of the supraspinatus tendon. The patient presents at this time for a reverse left total shoulder arthroplasty.  Procedure:   The patient underwent placement of an interscalene block using Exparel by the anesthesiologist in the preoperative holding area before being brought into the operating room and lain in the supine position. The patient then underwent general endotracheal intubation and anesthesia before the patient was repositioned in the beach chair position using the beach chair positioner. The left shoulder and upper extremity were prepped with ChloraPrep solution before being draped sterilely. Preoperative antibiotics were administered. A standard anterior approach to the shoulder  was made through an approximately 4-5 inch incision. The incision was carried down through the subcutaneous tissues to expose the deltopectoral fascia. The interval between the deltoid and pectoralis muscles was identified and this plane developed, retracting the cephalic vein laterally with the deltoid muscle. The conjoined tendon was identified. Its lateral margin was dissected and the Kolbel self-retraining retractor inserted. The "three sisters" were identified and cauterized. Bursal tissues were removed to improve visualization. The subscapularis tendon was released from its attachment to the lesser tuberosity 1 cm proximal to its insertion and several tagging sutures placed. The inferior capsule was released with care after identifying and protecting the axillary nerve. The proximal humeral cut was made at approximately 25 of retroversion using the extra-medullary guide.   Attention was redirected to the glenoid. The labrum was debrided circumferentially before the center of the glenoid was marked with electrocautery. The guidewire was drilled into the glenoid neck using the appropriate guide. After verifying its position, it was overreamed with the mini-baseplate reamer to create a flat surface over greater than 50% of the glenoid. The medium trial was positioned and this was felt to be the optimal amount of augment for the baseplate. The guidepin hole was created before the secondary reaming post was positioned and temporarily secured using a threaded screw. The secondary reaming was performed. The permanent medium augmented mini-baseplate was impacted into place. It was stabilized with a 25 x 6.5 mm central screw and four peripheral screws. Locking screws were placed superiorly and inferiorly while nonlocking screws were placed anteriorly and posteriorly. The permanent 36 mm glenosphere was then impacted into place and its Morse taper locking mechanism verified using  manual distraction.  Attention was  directed to the humeral side. The humeral canal was reamed sequentially beginning with the end-cutting reamer then progressing from a 4 mm reamer up to an 11 mm reamer. This provided excellent circumferential chatter. The canal was broached beginning with a #8 broach and progressing to a #11 broach. This was left in place and a trial reduction performed using the standard trial humeral platform. The arm demonstrated excellent range of motion as the hand could be brought across the chest to the opposite shoulder and brought to the top of the patient's head and to the patient's ear. The shoulder appeared stable throughout this range of motion. The joint was dislocated and the trial components removed. The permanent #11 micro-stem was impacted into place with care taken to maintain the appropriate version. The permanent 40 mm humeral platform with the standard insert was put together on the back table and impacted into place. Again, the [facility] taper locking mechanism was verified using manual distraction. The shoulder was relocated using two finger pressure and again placed through a range of motion with the findings as described above.  The wound was copiously irrigated with bacitracin saline solution using the jet lavage system before a total of 30 cc of 0.5% Sensorcaine with epinephrine was injected into the pericapsular and peri-incisional tissues to help with postoperative analgesia. The subscapularis tendon was reapproximated using #2 FiberWire interrupted sutures. The deltopectoral interval was closed using #0 Vicryl interrupted sutures before the subcutaneous tissues were closed using 2-0 Vicryl interrupted sutures. The skin was closed using staples. Prior to closing the skin, 1 g of transexemic acid in 10 cc of normal saline was injected intra-articularly to help with postoperative bleeding. A sterile occlusive dressing was applied to the wound before the arm was placed into a shoulder immobilizer with an  abduction pillow. A Polar Care system also was applied to the shoulder. The patient was then transferred back to a hospital bed before being awakened, extubated, and returned to the recovery room in satisfactory condition after tolerating the procedure well.

## 2019-01-03 NOTE — Progress Notes (Signed)
PHARMACIST - PHYSICIAN ORDER COMMUNICATION  CONCERNING: P&T Medication Policy on Herbal Medications  DESCRIPTION:  This patient's order for:  Biotin with Vit C and E   has been noted.  This product(s) is classified as an "herbal" or natural product. Due to a lack of definitive safety studies or FDA approval, nonstandard manufacturing practices, plus the potential risk of unknown drug-drug interactions while on inpatient medications, the Pharmacy and Therapeutics Committee does not permit the use of "herbal" or natural products of this type within [facility].   ACTION TAKEN: The pharmacy department is unable to verify this order at this time and your patient has been informed of this safety policy. Please reevaluate patient's clinical condition at discharge and address if the herbal or natural product(s) should be resumed at that time.

## 2019-01-03 NOTE — Evaluation (Signed)
Physical Therapy Evaluation Patient Details Name: Julie Wade MRN: [id number] DOB: 09-23-1940 Today's Date: 01/03/2019   History of Present Illness  78 y/o female s/p L reverse total shoulder 01/03/19.  Clinical Impression  Pt did well with PT exam and showed ability to ambulate with good safety, speed and confidence around the nurses' station.  Reviewed HEP, precautions and expected course of recovery.  Pt will have 24/7 assist available when she returns home, safe to do so from PT perspective.      Follow Up Recommendations Follow surgeon's recommendation for DC plan and follow-up therapies    Equipment Recommendations  None recommended by PT    Recommendations for Other Services       Precautions / Restrictions Precautions Precautions: Shoulder Precaution Booklet Issued: Yes (comment) Required Braces or Orthoses: Sling Restrictions Weight Bearing Restrictions: Yes LUE Weight Bearing: Non weight bearing      Mobility  Bed Mobility Overal bed mobility: Modified Independent             General bed mobility comments: minimal struggle to get to EOB, but able to do so w/o direct assist  Transfers Overall transfer level: Modified independent Equipment used: None             General transfer comment: Pt able to rise to standing with relative ease, mild stagger step initially but no LOBs  Ambulation/Gait Ambulation/Gait assistance: Supervision Gait Distance (Feet): 200 Feet Assistive device: None       General Gait Details: Pt walked with consistent cadence and speed, good confidence and no LOBs or overt safety issues.  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, stays active and independent     Hand Dominance        Extremity/Trunk Assessment   Upper Extremity Assessment Upper Extremity Assessment: (L in sling, R shld elevation <100, functional in limited ROM)    Lower Extremity Assessment Lower Extremity Assessment: Overall WFL for tasks assessed       Communication   Communication: No difficulties  Cognition Arousal/Alertness: Awake/alert Behavior During Therapy: WFL for tasks assessed/performed Overall Cognitive Status: Within Functional Limits for tasks assessed                                        General Comments      Exercises     Assessment/Plan    PT Assessment Patient needs continued PT services  PT Problem List Decreased strength;Decreased activity tolerance;Decreased range of motion;Decreased balance;Pain;Decreased knowledge of use of DME;Decreased coordination;Decreased mobility;Decreased cognition;Decreased safety awareness  PT Treatment Interventions Gait training;Functional mobility training;Therapeutic activities;Therapeutic exercise;Neuromuscular re-education;Balance training;Patient/family education    PT Goals (Current goals can be found in the Care Plan section)  Acute Rehab PT Goals Patient Stated Goal: go home PT Goal Formulation: With patient Time For Goal Achievement: 01/17/19 Potential to Achieve Goals: Good    Frequency BID   Barriers to discharge        Co-evaluation               AM-PAC PT "6 Clicks" Mobility  Outcome Measure Help needed turning from your back to your side while in a flat bed without using bedrails?: None Help  needed moving from lying on your back to sitting on the side of a flat bed without using bedrails?: None Help needed moving to and from a bed to a chair (including a wheelchair)?: None Help needed standing up from a chair using your arms (e.g., wheelchair or bedside chair)?: None Help needed to walk in hospital room?: None Help needed climbing 3-5 steps with a railing? : None 6 Click Score: 24    End of Session Equipment Utilized During Treatment: Gait belt Activity Tolerance: Patient tolerated treatment well Patient left: with call bell/phone within reach;with chair alarm set Nurse Communication: Mobility status PT Visit Diagnosis: Muscle weakness (generalized) (M62.81);Pain Pain - Right/Left: Left Pain - part of body: Shoulder    Time: [id number] PT Time Calculation (min) (ACUTE ONLY): 25 min   Charges:   PT Evaluation $PT Eval Low Complexity: 1 Low          Malachi ProGalen R Birdie Beveridge, DPT 01/03/2019, 6:01 PM

## 2019-01-03 NOTE — H&P (Signed)
Paper H&P to be scanned into permanent record. H&P reviewed and patient re-examined. No changes. 

## 2019-01-03 NOTE — Anesthesia Procedure Notes (Signed)
Anesthesia Regional Block: Interscalene brachial plexus block   Pre-Anesthetic Checklist: ,, timeout performed, Correct Patient, Correct Site, Correct Laterality, Correct Procedure, Correct Position, site marked, Risks and benefits discussed,  Surgical consent,  Pre-op evaluation,  At surgeon's request and post-op pain management  Laterality: Left  Prep: chloraprep       Needles:  Injection technique: Single-shot  Needle Type: Stimiplex     Needle Length: 10cm  Needle Gauge: 21   Needle insertion depth: 5 cm   Additional Needles:   Procedures: Doppler guided,,,, ultrasound used (permanent image in chart),,,,  Motor weakness within 8 minutes.  Narrative:  Start time: 01/03/2019 9:13 AM End time: 01/03/2019 9:25 AM Injection made incrementally with aspirations every 5 mL.  Performed by: Personally  Anesthesiologist: Alphonsus Sias, MD  Additional Notes: Functioning IV was confirmed, O2  and monitors were applied, mild sedation.  A 15mm 21ga Echoplex needle was used. Sterile prep and drape,hand hygiene and sterile gloves were used.  Negative aspiration and negative test dose prior to incremental administration of local anesthetic. Image stored on PACS. The patient tolerated the procedure well.

## 2019-01-03 NOTE — Anesthesia Procedure Notes (Signed)
Procedure Name: Intubation Date/Time: 01/03/2019 11:27 AM Performed by: Lavone Orn, CRNA Pre-anesthesia Checklist: Patient identified, Emergency Drugs available, Suction available, Patient being monitored and Timeout performed Patient Re-evaluated:Patient Re-evaluated prior to induction Oxygen Delivery Method: Circle system utilized Preoxygenation: Pre-oxygenation with 100% oxygen Induction Type: IV induction Ventilation: Mask ventilation without difficulty Laryngoscope Size: Mac and 3 Grade View: Grade I Tube type: Oral Tube size: 7.0 mm Number of attempts: 1 Airway Equipment and Method: Stylet Placement Confirmation: ETT inserted through vocal cords under direct vision,  positive ETCO2 and breath sounds checked- equal and bilateral Secured at: 20 cm Tube secured with: Tape Dental Injury: Teeth and Oropharynx as per pre-operative assessment

## 2019-01-03 NOTE — Anesthesia Preprocedure Evaluation (Addendum)
Anesthesia Evaluation  Patient identified by MRN, date of birth, ID band Patient awake    Reviewed: Allergy & Precautions, H&P , NPO status , reviewed documented beta blocker date and time   Airway Mallampati: III  TM Distance: >3 FB Neck ROM: full    Dental  (+) Upper Dentures, Missing, Partial Lower   Pulmonary    Pulmonary exam normal        Cardiovascular hypertension, + dysrhythmias Atrial Fibrillation  Rhythm:irregular  Hx of Afib, conversion to SR w PACs   Neuro/Psych PSYCHIATRIC DISORDERS Depression  Neuromuscular disease    GI/Hepatic   Endo/Other  Hypothyroidism   Renal/GU      Musculoskeletal  (+) Arthritis , Fibromyalgia -  Abdominal   Peds  Hematology   Anesthesia Other Findings Past Medical History: No date: Depression No date: Dysrhythmia     Comment:  atrial fib. No date: Fibromyalgia No date: Hypertension No date: Hypothyroidism Past Surgical History: No date: ABDOMINAL HYSTERECTOMY No date: BLEPHAROPLASTY No date: CATARACT EXTRACTION W/ INTRAOCULAR LENS & ANTERIOR VITRECTOMY No date: REPLACEMENT TOTAL KNEE; Right No date: TONSILLECTOMY   Reproductive/Obstetrics                            Anesthesia Physical Anesthesia Plan  ASA: III  Anesthesia Plan: General   Post-op Pain Management:  Regional for Post-op pain   Induction: Intravenous  PONV Risk Score and Plan: Ondansetron, Treatment may vary due to age or medical condition and Midazolam  Airway Management Planned: Oral ETT  Additional Equipment:   Intra-op Plan:   Post-operative Plan: Extubation in OR  Informed Consent: I have reviewed the patients History and Physical, chart, labs and discussed the procedure including the risks, benefits and alternatives for the proposed anesthesia with the patient or authorized representative who has indicated his/her understanding and acceptance.     Dental  Advisory Given  Plan Discussed with: CRNA  Anesthesia Plan Comments:         Anesthesia Quick Evaluation

## 2019-01-03 NOTE — Anesthesia Post-op Follow-up Note (Signed)
Anesthesia QCDR form completed.        

## 2019-01-04 LAB — BASIC METABOLIC PANEL
Anion gap: 7 (ref 5–15)
BUN: 15 mg/dL (ref 8–23)
CO2: 28 mmol/L (ref 22–32)
Calcium: 8.8 mg/dL — ABNORMAL LOW (ref 8.9–10.3)
Chloride: 99 mmol/L (ref 98–111)
Creatinine, Ser: 0.63 mg/dL (ref 0.44–1.00)
GFR calc Af Amer: >60 mL/min (ref >60)
GFR calc non Af Amer: >60 mL/min (ref >60)
Glucose, Bld: 147 mg/dL — ABNORMAL HIGH (ref 70–99)
Potassium: 4.6 mmol/L (ref 3.5–5.1)
Sodium: 134 mmol/L — ABNORMAL LOW (ref 135–145)

## 2019-01-04 LAB — CBC WITH DIFFERENTIAL/PLATELET
Abs Immature Granulocytes: 0.04 10*3/uL (ref 0.00–0.07)
Basophils Absolute: 0 10*3/uL (ref 0.0–0.1)
Basophils Relative: 0 %
Eosinophils Absolute: 0 10*3/uL (ref 0.0–0.5)
Eosinophils Relative: 0 %
HCT: 37.6 % (ref 36.0–46.0)
Hemoglobin: 12.6 g/dL (ref 12.0–15.0)
Immature Granulocytes: 0 %
Lymphocytes Relative: 10 %
Lymphs Abs: 0.9 10*3/uL (ref 0.7–4.0)
MCH: 30.9 pg (ref 26.0–34.0)
MCHC: 33.5 g/dL (ref 30.0–36.0)
MCV: 92.2 fL (ref 80.0–100.0)
Monocytes Absolute: 0.8 10*3/uL (ref 0.1–1.0)
Monocytes Relative: 9 %
Neutro Abs: 7.2 10*3/uL (ref 1.7–7.7)
Neutrophils Relative %: 81 %
Platelets: 206 10*3/uL (ref 150–400)
RBC: 4.08 MIL/uL (ref 3.87–5.11)
RDW: 12 % (ref 11.5–15.5)
WBC: 8.9 10*3/uL (ref 4.0–10.5)
nRBC: 0 % (ref 0.0–0.2)

## 2019-01-04 MED ORDER — DIPHENHYDRAMINE HCL 25 MG PO CAPS: 25.0000 mg | ORAL_CAPSULE | Freq: Every evening | ORAL | Status: DC | PRN

## 2019-01-04 NOTE — Evaluation (Signed)
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   Clinical Impression   [name] was seen for an OT evaluation this date. Pt lives with her husband in a 1 level home with a level entry. 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 Pt states her husband regularly provides her with assistance for bathing and dressing due to decreased ROM in BUE. Pt plans to stay with family to assist at home while recovering. Pt has orders for LUE (non-dominant) to be immobilized and will be NWBing per MD. Patient presents with impaired strength/ROM, pain, and sensation to LUE with block not completely resolved yet. These impairments result in a decreased ability to perform self care tasks requiring mod assist for UB/LB dressing and bathing and max assist for application of polar care, compression stockings, and sling/immobilizer. Pt instructed in polar care mgt, compression stockings mgt, sling/immobilizer mgt, ROM exercises for LUE (with instructions for no shoulder exercises until full sensation has returned), LUE precautions, adaptive strategies for bathing/dressing/toileting/grooming, positioning and considerations for sleep, and home/routines modifications to maximize falls prevention, safety, and independence. Handout provided.   Pt anxious and at times impulsive during session requiring increased education on safety awareness and falls prevention. OT adjusted sling/immobilizer and polar care to improve comfort, optimize positioning, and to maximize skin integrity/safety. Upon removing sling/immobilizer, OT noted asymmetry between pt R and L glenohumeral joints even with prompts to relax B UE. LUE was noted to be swollen around the acromion process. Pt also noted to have bruising on axillary side of left underarm. OT requested RN come to  assess. RN stated asymmetry likely surgical swelling and cleared pt to continue with therapy. Pt verbalized minimal understanding of all education/training provided, and requested review with her husband on next date to ensure carryover to home. Pt will benefit from skilled OT services to address these limitations and improve independence in daily tasks. Recommend HHOT services to continue therapy to maximize return to PLOF, address home/routines modifications and safety, minimize falls risk, and minimize caregiver burden.       Follow Up Recommendations  Home health OT    Equipment Recommendations  Tub/shower seat    Recommendations for Other Services       Precautions / Restrictions Precautions Precautions: Shoulder Shoulder Interventions: Shoulder sling/immobilizer Precaution Booklet Issued: No Restrictions Weight Bearing Restrictions: Yes LUE Weight Bearing: Non weight bearing      Mobility Bed Mobility Overal bed mobility: Needs Assistance Bed Mobility: Supine to Sit;Sit to Supine     Supine to sit: HOB elevated;Mod assist Sit to supine: Modified independent (Device/Increase time)   General bed mobility comments: Pt demonstrated significant lack of safety awareness during semi-sup to sit on this date. Attempted modified log-roll OOB with little regard for body or LUE positioning. Required Mod assist to come fully EOB. Pt stated she was having more difficulty due to being pain limited in her RUE from increased use. Extensive education in falls prevention strategies and safe bed mobility technique. Mod I for sit>sup.  Transfers Overall transfer level: Modified independent Equipment used: None             General transfer comment: easily rises to standing w/o assist    Balance Overall balance assessment: Independent;History of Falls  ADL either performed or assessed with clinical judgement   ADL Overall ADL's  : Needs assistance/impaired                                       General ADL Comments: Pt requires max-total assist to don sling/polar care on this date. Requires consistent cueing for safety as well as re-direction to tasks. Assessment of UB dressing limited. RN request pt not dress herself until ready to DC. Min assist for LB dressing (to pull up pants in back) and supervision for functional mobility.     Vision         Perception     Praxis      Pertinent Vitals/Pain Pain Assessment: 0-10 Pain Score: 5  Pain Location: RIGHT shoulder. LUE block still in place. Pain Intervention(s): Limited activity within patient's tolerance;Monitored during session;Repositioned     Hand Dominance Right   Extremity/Trunk Assessment Upper Extremity Assessment Upper Extremity Assessment: RUE deficits/detail;LUE deficits/detail RUE Deficits / Details: Decreased active shoulder ROM unable to flex shoulder above ~100. Pt requires assistance at baseline to complete ADL due to BUE limitations. [facility] W[facility]. RUE: Unable to fully assess due to pain RUE Coordination: decreased gross motor LUE Deficits / Details: S/p Rev. TSA. LUE in sling/immob NWB. LUE: Unable to fully assess due to immobilization LUE Coordination: decreased gross motor;decreased fine motor   Lower Extremity Assessment Lower Extremity Assessment: Overall WFL for tasks assessed;Defer to PT evaluation   Cervical / Trunk Assessment Cervical / Trunk Assessment: Normal   Communication Communication Communication: No difficulties   Cognition Arousal/Alertness: Awake/alert Behavior During Therapy: WFL for tasks assessed/performed;Anxious;Impulsive Overall Cognitive Status: Within Functional Limits for tasks assessed                                 General Comments: Pt had difficulty adhereing to shoulder precautions on this date. Required consistent cueing for safety and to re-direct to task t/o session.  Tends to move very quickly and at times impulsively. Needs reminders to slow down.   General Comments  Pt noted to have some asymmetry between L and R shoulders on this date. Pt has a significantly difficult time relaxing LUE and tends to keep it elevated and guarded despite block still being in place with 0/10 pain. This Thereasa Parkinauthor also noted bruising around pt LUE underarm. RN in room to assess at this therapists request. RN stated asymmetry likely due to swelling from sx and brusing from pt "fragile skin".  RN did not endorse further concerns or instruct this therapist in any additional precautions. Polar care re-applied with #2 strap left off for pt comfort 2/2 underarm brusing. OT will continue to monitor and provided pt education on shoulder precautions.    Exercises Exercises: Other exercises Other Exercises Other Exercises: performed PROM activites for R shoulder flexion and abd to 90, ER to neutral (15 reps each on L) all w/o pain or structural limitations.  Reviewed elbow, wrist, hand HEP. Other Exercises: Pt educated in falls prevention strategies, shoulder precautions, LUE ROM exercises with strict instructions not to engage in shoulder ROM until block has fully worn off, safe bed mobility techniques, sling/polar care mgt. Pt would benefit from further education in all areas mentioned.   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, stays active and independent        OT Problem List: Decreased strength;Decreased coordination;Pain;Decreased range of  motion;Decreased safety awareness;Decreased activity tolerance;Decreased knowledge of use of DME or AE;Decreased knowledge of precautions;Impaired UE functional use      OT Treatment/Interventions: Self-care/ADL training;Therapeutic exercise;Therapeutic activities;DME and/or AE instruction;Patient/family education;Balance training;Modalities    OT Goals(Current goals can be found in the care plan section) Acute Rehab OT Goals Patient Stated Goal: go home OT Goal Formulation: With patient Time For Goal Achievement: 01/18/19 Potential to Achieve Goals: Good ADL Goals Pt Will Perform Upper Body Bathing: sitting;with min assist;with caregiver independent in assisting;with adaptive equipment(With LRAD PRN for improved safety and functional independence) Pt Will Perform Upper Body Dressing: sitting;with adaptive equipment;with min assist;with caregiver independent in assisting(With LRAD PRN for improved safety and functional independence) Additional ADL Goal #1: Pt will independently verbalize a plan to implement at least 3 learned falls preventions strategies into her daily routine upon hospita DC for improved safety and functional independence during meaningful occupations of daily life.  OT Frequency: Min 1X/week   Barriers to D/C:            Co-evaluation              AM-PAC OT "6 Clicks" Daily Activity     Outcome Measure Help from another person eating meals?: None Help from another person taking care of personal grooming?: A Little Help from another person toileting, which includes using toliet, bedpan, or urinal?: A Little Help from another person bathing (including washing, rinsing, drying)?: A Lot Help from another person to put on and taking off regular upper body clothing?: A Lot Help from another person to put on and taking off regular lower body clothing?: A Little 6 Click Score: 17   End of Session Equipment Utilized During Treatment: Gait belt Nurse Communication:  Other (comment)(Pt back to bed and requesting to be allowed to sleep.)  Activity Tolerance: Patient tolerated treatment well Patient left: in bed;with call bell/phone within reach;with bed alarm set;with SCD's reapplied(With sling and polar care in place.)  OT Visit Diagnosis: Other abnormalities of gait and mobility (R26.89);History of falling (Z91.81);Pain Pain - Right/Left: Right(non-operative) Pain - part of body: Shoulder                Time: [id number] OT Time Calculation (min): 43 min Charges:  OT General Charges $OT Visit: 1 Visit OT Evaluation $OT Eval Moderate Complexity: 1 Mod OT Treatments $Self Care/Home Management : 38-52 mins  Rockney GheeSerenity Jaber Dunlow, M.S., OTR/L Ascom: [phone] 01/04/19, 3:52 PM

## 2019-01-04 NOTE — Progress Notes (Signed)
Physical Therapy Treatment Patient Details Name: Julie Wade MRN: [id number] DOB: 01/31/1941 Today's Date: 01/04/2019    History of Present Illness 78 y/o female s/p L reverse total shoulder 01/03/19.    PT Comments    Pt generally did very well, confident with ambulation and mobility.  Pt largely able to get pants on independently only needed assist in set up as tag was on side and not back of pants and got her confused.  She was able to negotiate up/down steps w/o issue and though she reports feeling somewhat hyper (meds?) and had some impulsivity she ultimately was safe with mobility/ambulation/etc.  She tolerated PROM exercises for the L shoulder to allowable ranges w/o issue or structural resistance.  Pt is safe to return home from PT stand-point.  Follow Up Recommendations  Follow surgeon's recommendation for DC plan and follow-up therapies     Equipment Recommendations  None recommended by PT    Recommendations for Other Services       Precautions / Restrictions Precautions Precautions: Shoulder Shoulder Interventions: Shoulder sling/immobilizer Restrictions LUE Weight Bearing: Non weight bearing    Mobility  Bed Mobility Overal bed mobility: Modified Independent             General bed mobility comments: confidently gets back into bed post ambulation, no assist needed  Transfers Overall transfer level: Modified independent Equipment used: None             General transfer comment: easily rises to standing w/o assist  Ambulation/Gait Ambulation/Gait assistance: Modified independent (Device/Increase time) Gait Distance (Feet): 250 Feet Assistive device: None       General Gait Details: Pt walked with consistent cadence and speed, good confidence and no LOBs or overt safety issues.  O2 remained in the high 90s, HR did increase to 110s   Stairs Stairs: Yes Stairs assistance: Modified independent (Device/Increase time) Stair Management: One rail  Right Number of Stairs: 4 General stair comments: Pt is able to negotiate up/down steps w/o assist and w/o hesitation or safety issues   Wheelchair Mobility    Modified Rankin (Stroke Patients Only)       Balance Overall balance assessment: Independent                                          Cognition Arousal/Alertness: Awake/alert Behavior During Therapy: WFL for tasks assessed/performed Overall Cognitive Status: Within Functional Limits for tasks assessed                                        Exercises Other Exercises Other Exercises: performed PROM activites for R shoulder flexion and abd to 90, ER to neutral (15 reps each on L) all w/o pain or structural limitations.  Reviewed elbow, wrist, hand HEP.    General Comments        Pertinent Vitals/Pain Pain Assessment: No/denies pain(shoulder still numb)    Home Living                      Prior Function            PT Goals (current goals can now be found in the care plan section) Progress towards PT goals: Progressing toward goals    Frequency    7X/week      PT  Plan Frequency needs to be updated    Co-evaluation              AM-PAC PT "6 Clicks" Mobility   Outcome Measure  Help needed turning from your back to your side while in a flat bed without using bedrails?: None Help needed moving from lying on your back to sitting on the side of a flat bed without using bedrails?: None Help needed moving to and from a bed to a chair (including a wheelchair)?: None Help needed standing up from a chair using your arms (e.g., wheelchair or bedside chair)?: None Help needed to walk in hospital room?: None Help needed climbing 3-5 steps with a railing? : None 6 Click Score: 24    End of Session Equipment Utilized During Treatment: Gait belt Activity Tolerance: Patient tolerated treatment well Patient left: with call bell/phone within reach;with chair alarm  set Nurse Communication: Mobility status PT Visit Diagnosis: Muscle weakness (generalized) (M62.81);Pain Pain - Right/Left: Left Pain - part of body: Shoulder     Time: 4562-5638 PT Time Calculation (min) (ACUTE ONLY): 29 min  Charges:  $Gait Training: 8-22 mins $Therapeutic Exercise: 8-22 mins                     Kreg Shropshire, DPT 01/04/2019, 1:29 PM

## 2019-01-04 NOTE — Progress Notes (Signed)
OT Cancellation Note  Patient Details Name: Julie Wade MRN: [id number] DOB: 10-27-1940   Cancelled Treatment:    Reason Eval/Treat Not Completed: Fatigue/lethargy limiting ability to participate;Patient declined, no reason specified. Thank you for the OT consult. Order received and chart reviewed. Upon arrival to pt room, pt sleeping in bed. Wakes to verbal cues. Politely requests OT return at a later time so she can get "another hour of sleep". Will re-attempt at a later time this date, as available.   Shara Blazing, M.S., OTR/L Ascom: [phone] 01/04/19, 8:41 AM

## 2019-01-04 NOTE — Anesthesia Postprocedure Evaluation (Signed)
Anesthesia Post Note  Patient: Julie Wade  Procedure(s) Performed: REVERSE SHOULDER ARTHROPLASTY - RNFA (Left Shoulder)  Patient location during evaluation: PACU Anesthesia Type: General Level of consciousness: awake and alert Pain management: pain level controlled (Good block functn) Vital Signs Assessment: post-procedure vital signs reviewed and stable Respiratory status: spontaneous breathing, nonlabored ventilation, respiratory function stable and patient connected to nasal cannula oxygen Cardiovascular status: blood pressure returned to baseline and stable Postop Assessment: no apparent nausea or vomiting Anesthetic complications: no     Last Vitals:  Vitals:   01/04/19 0356 01/04/19 0755  BP: 129/67 131/74  Pulse: (!) 57 61  Resp: 17 18  Temp: 36.6 C 36.9 C  SpO2: 96% 99%    Last Pain:  Vitals:   01/04/19 1159  TempSrc:   PainSc: 0-No pain                 Alphonsus Sias

## 2019-01-04 NOTE — Progress Notes (Signed)
   Subjective: 1 Day Post-Op Procedure(s) (LRB): REVERSE SHOULDER ARTHROPLASTY - RNFA (Left) Patient reports pain as mild.   Patient is well, and has had no acute complaints or problems Denies any CP, SOB, ABD pain. We will continue therapy today.  Plan is to go Home after hospital stay.  Objective: Vital signs in last 24 hours: Temp:  [97.6 F (36.4 C)-98.7 F (37.1 C)] 98.4 F (36.9 C) (08/14 0755) Pulse Rate:  [56-86] 61 (08/14 0755) Resp:  [9-20] 18 (08/14 0755) BP: (124-166)/(67-101) 131/74 (08/14 0755) SpO2:  [95 %-100 %] 99 % (08/14 0755) Weight:  [74.8 kg] 74.8 kg (08/13 0824)  Intake/Output from previous day: 08/13 0701 - 08/14 0700 In: 1503 [P.O.:100; I.V.:1100; IV Piggyback:303] Out: 600 [Urine:400; Blood:200] Intake/Output this shift: No intake/output data recorded.  Recent Labs    01/04/19 0348  HGB 12.6   Recent Labs    01/04/19 0348  WBC 8.9  RBC 4.08  HCT 37.6  PLT 206   Recent Labs    01/04/19 0348  NA 134*  K 4.6  CL 99  CO2 28  BUN 15  CREATININE 0.63  GLUCOSE 147*  CALCIUM 8.8*   No results for input(s): LABPT, INR in the last 72 hours.  EXAM General - Patient is Alert, Appropriate and Oriented Left upper extremity - Intact pulses distally No cellulitis present Compartment soft  No significant swelling.  Tolerating sling and abduction pillow well. Dressing - dressing C/D/I Motor Function - intact, moving foot and toes well on exam.   Past Medical History:  Diagnosis Date  . Depression   . Dysrhythmia    atrial fib.  . Fibromyalgia   . Hypertension   . Hypothyroidism     Assessment/Plan:   1 Day Post-Op Procedure(s) (LRB): REVERSE SHOULDER ARTHROPLASTY - RNFA (Left) Active Problems:   Status post reverse arthroplasty of shoulder, left  Estimated body mass index is 29.23 kg/m as calculated from the following:   Height as of this encounter: 5\' 3"  (1.6 m).   Weight as of this encounter: 74.8 kg. Advance diet Up with  therapy Pain well controlled. Labs and vital signs are stable Plan on discharge to home tomorrow DVT Prophylaxis - Eliquis    T. Rachelle Hora, PA-C [place] 01/04/2019, 8:05 AM

## 2019-01-05 LAB — BASIC METABOLIC PANEL
Anion gap: 8 (ref 5–15)
BUN: 17 mg/dL (ref 8–23)
CO2: 29 mmol/L (ref 22–32)
Calcium: 8.6 mg/dL — ABNORMAL LOW (ref 8.9–10.3)
Chloride: 97 mmol/L — ABNORMAL LOW (ref 98–111)
Creatinine, Ser: 0.66 mg/dL (ref 0.44–1.00)
GFR calc Af Amer: >60 mL/min (ref >60)
GFR calc non Af Amer: >60 mL/min (ref >60)
Glucose, Bld: 104 mg/dL — ABNORMAL HIGH (ref 70–99)
Potassium: 3.7 mmol/L (ref 3.5–5.1)
Sodium: 134 mmol/L — ABNORMAL LOW (ref 135–145)

## 2019-01-05 MED ORDER — OXYCODONE HCL 5 MG PO TABS: 5.0000 mg | ORAL_TABLET | ORAL | 0 refills | Status: AC | PRN

## 2019-01-05 MED ORDER — TRAMADOL HCL 50 MG PO TABS: 50.0000 mg | ORAL_TABLET | Freq: Four times a day (QID) | ORAL | 1 refills | Status: AC | PRN

## 2019-01-05 NOTE — Discharge Summary (Signed)
Physician Discharge Summary  Subjective: 2 Days Post-Op Procedure(s) (LRB): REVERSE SHOULDER ARTHROPLASTY - RNFA (Left) Patient reports pain as mild.   Patient seen in rounds with Dr. Roland Rack. Patient is well, and has had no acute complaints or problems Patient is ready to go home with home health physical therapy  Physician Discharge Summary  Patient ID: Julie Wade MRN: [id number] DOB/AGE: 78/13/1942 78 y.o.  Admit date: 01/03/2019 Discharge date: 01/05/2019  Admission Diagnoses:  Discharge Diagnoses:  Active Problems:   Status post reverse arthroplasty of shoulder, left   Discharged Condition: fair  Hospital Course: The patient is postop day 2 from a reverse shoulder replacement on the left.  She has done well with physical therapy has ambulated 250 feet.  Her vitals have remained stable.  She is still working on a bowel movement before discharge today.  She will go home with home health physical therapy.  Treatments: surgery:  Reverse left total shoulder arthroplasty.  Surgeon:   Pascal Lux, MD  Assistant:   Cameron Proud, PA-C; Freddie Apley, PA-S  Anesthesia:   General endotracheal with an interscalene block using Exparel placed preoperatively by the anesthesiologist.  Findings:   As above.  Complications:   None  EBL:    200 cc  Fluids:   1000 cc crystalloid  UOP:   None  TT:   None  Drains:   None  Closure:   Staples  Implants:   All press-fit Bi[employer] Comprehensive system with a #11 micro-humeral stem, a 40 mm humeral tray with a standard insert, and a medium augmented mini-base plate with a 36 mm glenosphere.  Discharge Exam: Blood pressure (!) 150/71, pulse 62, temperature 98.2 F (36.8 C), resp. rate 18, height 5\' 3"  (1.6 m), weight 74.8 kg, SpO2 96 %.   Disposition: Discharge disposition: 01-Home or Self Care        Allergies as of 01/05/2019      Reactions   Codeine Nausea Only      Medication List    TAKE these  medications   atorvastatin 20 MG tablet Commonly known as: LIPITOR Take 20 mg by mouth daily.   DULoxetine 60 MG capsule Commonly known as: CYMBALTA Take 60 mg by mouth at bedtime.   Eliquis 5 MG Tabs tablet Generic drug: apixaban Take 5 mg by mouth 2 (two) times daily.   HAIR/SKIN/NAILS PO Take 1 tablet by mouth daily.   levothyroxine 137 MCG tablet Commonly known as: SYNTHROID Take 137 mcg by mouth daily before breakfast.   metoprolol tartrate 25 MG tablet Commonly known as: LOPRESSOR Take 25 mg by mouth 2 (two) times daily.   Omega-3 500 MG Caps Take 500 mg by mouth daily.   oxyCODONE 5 MG immediate release tablet Commonly known as: Oxy IR/ROXICODONE Take 1-2 tablets (5-10 mg total) by mouth every 4 (four) hours as needed for moderate pain (pain score 4-6).   traMADol 50 MG tablet Commonly known as: ULTRAM Take 1 tablet (50 mg total) by mouth every 6 (six) hours as needed for moderate pain.   vitamin E 400 UNIT capsule Take 400 Units by mouth daily.      Follow-up Information    Poggi, Marshall Cork, MD. Go in 2 week(s).   Specialty: Surgery Contact information: [place] [address] [phone]           Signed: Prescott Parma, Kem Parcher 01/05/2019, 7:17 AM   Objective: Vital signs in last 24 hours: Temp:  [98.2 F (36.8  C)-98.4 F (36.9 C)] 98.2 F (36.8 C) (08/14 1700) Pulse Rate:  [61-64] 62 (08/14 2227) Resp:  [16-18] 18 (08/14 2227) BP: (131-150)/(71-78) 150/71 (08/14 2227) SpO2:  [96 %-99 %] 96 % (08/14 2227)  Intake/Output from previous day:  Intake/Output Summary (Last 24 hours) at 01/05/2019 0717 Last data filed at 01/04/2019 1753 Gross per 24 hour  Intake 600 ml  Output -  Net 600 ml    Intake/Output this shift: No intake/output data recorded.  Labs: Recent Labs    01/04/19 0348  HGB 12.6   Recent Labs    01/04/19 0348  WBC 8.9  RBC 4.08  HCT 37.6  PLT 206   Recent Labs    01/04/19 0348  01/05/19 0405  NA 134* 134*  K 4.6 3.7  CL 99 97*  CO2 28 29  BUN 15 17  CREATININE 0.63 0.66  GLUCOSE 147* 104*  CALCIUM 8.8* 8.6*   No results for input(s): LABPT, INR in the last 72 hours.  EXAM: General - Patient is Alert and Oriented Extremity - Sensation intact distally Dorsiflexion/Plantar flexion intact Compartment soft Incision - clean, dry, no drainage Motor Function -grip strength intact.  Plantarflexion and dorsiflexion of the wrist intact.  Assessment/Plan: 2 Days Post-Op Procedure(s) (LRB): REVERSE SHOULDER ARTHROPLASTY - RNFA (Left) Procedure(s) (LRB): REVERSE SHOULDER ARTHROPLASTY - RNFA (Left) Past Medical History:  Diagnosis Date  . Depression   . Dysrhythmia    atrial fib.  . Fibromyalgia   . Hypertension   . Hypothyroidism    Active Problems:   Status post reverse arthroplasty of shoulder, left  Estimated body mass index is 29.23 kg/m as calculated from the following:   Height as of this encounter: 5\' 3"  (1.6 m).   Weight as of this encounter: 74.8 kg. Advance diet Up with therapy Discharge home with home health Diet - Regular diet Follow up - in 2 weeks Activity -shoulder immobilizer on the left arm at all times except for physical therapy. Disposition - Home Condition Upon Discharge - Stable DVT Prophylaxis - Eliquis  Dedra Skeensodd Binh Doten, PA-C Orthopaedic Surgery 01/05/2019, 7:17 AM

## 2019-01-05 NOTE — Discharge Instructions (Signed)
Dr Joice LoftsPoggi, MD  [facility]  Phone: [phone]  Fax: [phone]   Discharge Instructions after Reverse Shoulder Replacement   1. Activity/Sling: You are to be non-weight bearing on operative extremity. A sling/shoulder immobilizer has been provided for you. Only remove the sling to perform elbow, wrist, and hand RoM exercises and hygiene/dressing. Active reaching and lifting are not permitted. You will be given further instructions on sling use at your first physical therapy visit and postoperative visit with Dr. Joice LoftsPoggi.   2. Dressings: Dressing may be removed at 1st physical therapy visit (~3-4 days after surgery). Afterwards, you may either leave open to air (if no drainage) or cover with dry, sterile dressing. If you have steri-strips on your wound, please do not remove them. They will fall off on their own. You may shower 5 days after surgery. Please pat incision dry. Do not rub or place any shear forces across incision. If there is drainage or any opening of incision after 5 days, please notify our offices immediately.   3. Driving:  Plan on not driving for six weeks. Please note that you are advised NOT to drive while taking narcotic pain medications as you may be impaired and unsafe to drive.  4. Medications:  - You have been provided a prescription for narcotic pain medicine (usually oxycodone). After surgery, take 1-2 narcotic tablets every 4 hours if needed for severe pain. Please start this as soon as you begin to start having pain (if you received a nerve block, start taking as soon as this wears off).  - A prescription for anti-nausea medication will be provided in case the narcotic medicine causes nausea - take 1 tablet every 6 hours only if nauseated.  - Take enteric coated aspirin 325 mg once daily for 6 weeks to prevent blood clots. Do not take aspirin if you have an aspirin sensitivity/allergy or asthma or are on an anticoagulant (blood thinner) already. If so, then your  home anticoagulant will be resume and managed - do not take aspirin. -Take tylenol 1000mg  (2 Extra strength or 3 regular strength tablets) every 8 hours for pain. This will reduce the amount of narcotic medication needed. May stop tylenol when you are having minimal pain. - Take a stool softener (Colace, Dulcolax or Senakot) if you are using narcotic pain medications to help with constipation that is associated with narcotic use. - DO NOT take ANY nonsteroidal anti-inflammatory pain medications: Advil, Motrin, Ibuprofen, Aleve, Naproxen, or Naprosyn.  If you are taking prescription medication for anxiety, depression, insomnia, muscle spasm, chronic pain, or for attention deficit disorder you are advised that you are at a higher risk of adverse effects with use of narcotics post-op, including narcotic addiction/dependence, depressed breathing, death. If you use non-prescribed substances: alcohol, marijuana, cocaine, heroin, methamphetamines, etc., you are at a higher risk of adverse effects with use of narcotics post-op, including narcotic addiction/dependence, depressed breathing, death. You are advised that taking > 50 morphine milligram equivalents (MME) of narcotic pain medication per day results in twice the risk of overdose or death. For your prescription provided: oxycodone 5 mg - taking more than 6 tablets per day after the first few days of surgery.  5. Physical Therapy: 1-2 times per week for ~12 weeks. Therapy typically starts on post operative Day 3 or 4. You have been provided an order for physical therapy. The therapist will provide home exercises. Please contact our offices if this appointment has not been scheduled.   6. Work: May do light  duty/desk job in approximately 2 weeks when off of narcotics, pain is well-controlled, and swelling has decreased if able to function with one arm in sling. Full work may take 6 weeks if light motions and function of both arms is required. Lifting  jobs may require 12 weeks.  7. Post-Op Appointments: Your first post-op appointment will be with Dr. Joice LoftsPoggi in approximately 2 weeks time.   If you find that they have not been scheduled please call the Orthopaedic Appointment front desk at [phone].                               Dr Joice LoftsPoggi, MD [facility] Phone: [phone] Fax: [phone]   REVERSE SHOULDER ARTHROPLASTY REHAB GUIDELINES   These guidelines should be tailored to individual patients based on their rehab goals, age, precautions, quality of repair, etc.  Progression should be based on patient progress and approval by the referring physician.  PHASE 1 - Day 1 through Week 2  GENERAL GUIDELINES AND PRECAUTIONS  Sling wear 24/7 except during grooming and home exercises (3 to 5 times daily)  Avoid shoulder extension such that the arm is posterior the frontal plane.  When patients recline, a pillow should be placed behind the upper arm and sling should be on.  They should be advised to always be able to see the elbow  Avoid combined IR/ADD/EXT, such as hand behind back to prevent dislocation  Avoid combined IR and ADD such as reaching across the chest to prevent dislocation  No AROM  No submersion in pool/water for 4 weeks  No weight bearing through operative arm (as in transfers, walker use, etc)  GOALS  Maintain integrity of joint replacement; protect soft tissue healing  Increase PROM for elevation to 120 and ER to 30 (will remain the goal for first 6 weeks)  Optimize distal UE circulation and muscle activity (elbow, wrist and hand)  Instruct in use of sling for proper fit, polar care device for ice application after HEP, signs/symptoms of infection  EXERCISES  Active elbow, wrist and hand  Passive forward elevation in scapular plane to 90-120 max motion; ER in scapular plane to 30  Active scapular retraction with arms resting in neutral position  CRITERIA TO  PROGRESS TO PHASE 2  Low pain (less than 3/10) with shoulder PROM  Healing of incision without signs of infection  Clearance by MD to advance after 2 week MD check up  PHASE 2 - 2 weeks - 6 weeks  GENERAL GUIDELINES AND PRECAUTIONS  Sling may be removed while at home; worn in community without abduction pillow  May use arm for light activities of daily living (such as feeding, brushing teeth, dressing) with elbow near  the side of the body  and arm in front of the body- no active lifting of the arm  May submerge in water (tub, pool, HerrinJacuzzi, etc) after 4 weeks  Continue to avoid WBing through the operative arm  Continue to avoid combined IR/EXT/ADD (hand behind the back) and IR/ADD  (reaching across chest) for dislocation precautions  GOALS   Achieve passive elevation to 120 and ER to 30   Low (less than 3/10) to no pain   Ability to fire all heads of the deltoid  EXERCISES  May discontinue grip, and active elbow and wrist exercises since using the arm in ADLs  with sling removed around the home  Continue passive elevation to 120 and ER to  30, both in scapular plane with arm supported on table top  Add submaximal isometrics, pain free effort, for all functional heads of deltoid (anterior, posterior, middle)  Ensure that with posterior deltoid isometric the shoulder does not move into extension and the arm remains anterior the frontal plane  At 4 weeks:  begin to place arm in balanced position of 90 deg elevation in supine; when patient able to hold this position with ease, may begin reverse pendulums clockwise and counterclockwise  CRITERIA TO PROGRESS TO PHASE 3  Passive forward elevation in scapular plane to 120; passive ER in scapular plane to 30  Ability to fire isometrically all heads of the deltoid muscle without pain  Ability to place and hold the arm in balanced position (90 deg elevation in supine)  PHASE 3 - 6 weeks to 3 months  GENERAL GUIDELINES AND  PRECAUTIONS  Discontinue use of sling  Avoid forcing end range motion in any direction to prevent dislocation   May advance use of the arm actively in ADLs without being restricted to arm by the side of the body, however, avoid heavy lifting and sports (forever!)  May initiate functional IR behind the back gently  NO UPPER BODY ERGOMETER   GOALS  Optimize PROM for elevation and ER in scapular plane with realistic expectation that max  mobility for elevation is usually around 145-160 passively; ER 40 to 50 passively; functional IR to L1  Recover AROM to approach as close to PROM available as possible; may expect 135-150 deg active elevation; 30 deg active ER; active functional IR to L1  Establish dynamic stability of the shoulder with deltoid and periscapular muscle gradual strengthening  EXERCISES  Forward elevation in scapular plane active progression: supine to incline, to vertical; short to long lever arm  Balanced position long lever arm AROM  Active ER/IR with arm at side  Scapular retraction with light band resistance  Functional IR with hand slide up back - very gentle and gradual  NO UPPER BODY ERGOMETER     CRITERIA TO PROGRESS TO PHASE 4   AROM equals/approaches PROM with good mechanics for elevation    No pain   Higher level demand on shoulder than ADL functions   PHASE 4 12 months and beyond  GENERAL GUIDELINES AND PRECAUTIONS  No heavy lifting and no overhead sports  No heavy pushing activity  Gradually increase strength of deltoid and scapular stabilizers; also the rotator cuff if present with weights not to exceed 5 lbs  NO UPPER BODY ERGOMETER   GOALS   Optimize functional use of the operative UE to meet the desired demands   Gradual increase in deltoid, scapular muscle, and rotator cuff strength   Pain free functional activities   EXERCISES  Add light hand weights for deltoid up to and not to exceed 3 lbs for anterior and posterior  with long arm lift against gravity; elbow bent to 90 deg for abduction in scapular plane  Theraband progression for extension to hip with scapular depression/retraction  Theraband progression for serratus anterior punches in supine; avoid wall, incline or prone pressups for serratus anterior  End range stretching gently without forceful overpressure in all planes (elevation in scapular plane, ER in scapular plane, functional IR) with stretching done for life as part of a daily routine  NO UPPER BODY ERGOMETER     CRITERIA FOR DISCHARGE FROM SKILLED PHYSICAL THERAPY   Pain free AROM for shoulder elevation (expect around 135-150)   Functional strength for  all ADLs, work tasks, and hobbies approved by Psychologist, sport and exercise   Independence with home maintenance program   NOTES: 1. With proper exercise, motion, strength, and function continue to improve even after one year. 2. The complication rate after surgery is 5 - 8%. Complications include infection, fracture, heterotopic bone formation, nerve injury, instability, rotator cuff tear, and tuberosity nonunion. Please look for clinical signs, unusual symptoms, or lack of progress with therapy and report those to Dr. Roland Rack. Prefer more communication than less.  3. The therapy plan above only serves as a guide. Please be aware of specific individualized patient instructions as written on the prescription or through discussions with the surgeon. 4. Please call Dr. Roland Rack if you have any specific questions or concerns [phone]

## 2019-01-05 NOTE — Progress Notes (Signed)
Pt ready for discharge home today per MD. Patient assessment unchanged from this morning. Pt had BM. She met PT goals; CM set up home health. Dressing changed per order. Staples to incision CDI. Reviewed discharge instructions and prescriptions with pt and her significant other; all questions answered and pt verbalized understanding. PIV removed, VSS. Pt assisted to car via NT.   Julie Wade

## 2019-01-05 NOTE — Progress Notes (Signed)
   Subjective: 2 Days Post-Op Procedure(s) (LRB): REVERSE SHOULDER ARTHROPLASTY - RNFA (Left) Patient reports pain as mild.   Patient is well, and has had no acute complaints or problems Denies any CP, SOB, ABD pain. We will continue therapy today.  Plan is to go Home after hospital stay.  Plan for today.  Objective: Vital signs in last 24 hours: Temp:  [98.2 F (36.8 C)-98.4 F (36.9 C)] 98.2 F (36.8 C) (08/14 1700) Pulse Rate:  [61-64] 62 (08/14 2227) Resp:  [16-18] 18 (08/14 2227) BP: (131-150)/(71-78) 150/71 (08/14 2227) SpO2:  [96 %-99 %] 96 % (08/14 2227)  Intake/Output from previous day: 08/14 0701 - 08/15 0700 In: 600 [P.O.:600] Out: -  Intake/Output this shift: No intake/output data recorded.  Recent Labs    01/04/19 0348  HGB 12.6   Recent Labs    01/04/19 0348  WBC 8.9  RBC 4.08  HCT 37.6  PLT 206   Recent Labs    01/04/19 0348 01/05/19 0405  NA 134* 134*  K 4.6 3.7  CL 99 97*  CO2 28 29  BUN 15 17  CREATININE 0.63 0.66  GLUCOSE 147* 104*  CALCIUM 8.8* 8.6*   No results for input(s): LABPT, INR in the last 72 hours.  EXAM General - Patient is Alert, Appropriate and Oriented Left upper extremity - Intact pulses distally No cellulitis present Compartment soft  No significant swelling.  Tolerating sling and abduction pillow well. Dressing - dressing C/D/I Motor Function - intact, moving foot and toes well on exam.  Ambulated 250 feet.  Past Medical History:  Diagnosis Date  . Depression   . Dysrhythmia    atrial fib.  . Fibromyalgia   . Hypertension   . Hypothyroidism     Assessment/Plan:   2 Days Post-Op Procedure(s) (LRB): REVERSE SHOULDER ARTHROPLASTY - RNFA (Left) Active Problems:   Status post reverse arthroplasty of shoulder, left  Estimated body mass index is 29.23 kg/m as calculated from the following:   Height as of this encounter: 5\' 3"  (1.6 m).   Weight as of this encounter: 74.8 kg. Advance diet Up with  therapy Pain well controlled. Labs and vital signs are stable Plan on discharge to home today DVT Prophylaxis - Eliquis    Reche Dixon, PA-C [place] 01/05/2019, 7:11 AM

## 2019-01-05 NOTE — Progress Notes (Signed)
Physical Therapy Treatment Patient Details Name: Julie Wade MRN: [id number] DOB: 09/13/1940 Today's Date: 01/05/2019    History of Present Illness 78 y/o female s/p L reverse total shoulder 01/03/19.    PT Comments    Pt in recliner, ready for session.  Before path could be cleared, pt stood quickly from chair despite instruction to remain sitting.  Pt with polar care hooked up, chair alarm on and SCD's attached with tray table in front of chair.  Pt again instructed to sit down.  Education provided regarding safety.  She again stood quickly when tasks were only half completed and began walking with SCD's falling down under her feet causing a slippery surface to walk on.  Pt was not concerned.  They were removed and she was able to walk around unit and complete stair training with no LOB or buckling.  Returned to room and used commode for small BM.  stood for personal care but turned around from commode with her pants down around her knees.  Again education provided regarding safety.  Repositioned in chair with chair alarm on.  Reviewed safety concerns and importance of slowing down and making sure obstacles were out of her way and an overall safe environment to walk in.  She voiced understanding and agreed.  While gait is generally steady without LOB her lack of concern and awareness of safety issues puts her at an increased fall risk.  Stated she lives with her husband who will be there to assist her.     Follow Up Recommendations  Follow surgeon's recommendation for DC plan and follow-up therapies  +1 assist for mobility for safety due to awareness and impulsivity.   Equipment Recommendations  None recommended by PT    Recommendations for Other Services       Precautions / Restrictions Precautions Precautions: Shoulder Shoulder Interventions: Shoulder sling/immobilizer Precaution Booklet Issued: No Required Braces or Orthoses: Sling Restrictions Weight Bearing Restrictions: Yes LUE  Weight Bearing: Non weight bearing    Mobility  Bed Mobility               General bed mobility comments: in recliner  Transfers Overall transfer level: Modified independent                  Ambulation/Gait Ambulation/Gait assistance: Modified independent (Device/Increase time) Gait Distance (Feet): 200 Feet Assistive device: None Gait Pattern/deviations: Step-through pattern   Gait velocity interpretation: 1.31 - 2.62 ft/sec, indicative of limited community ambulator General Gait Details: no LOb noted   Stairs Stairs: Yes Stairs assistance: Modified independent (Device/Increase time) Stair Management: One rail Right Number of Stairs: 4 General stair comments: Pt is able to negotiate up/down steps w/o assist and w/o hesitation or safety issues   Wheelchair Mobility    Modified Rankin (Stroke Patients Only)       Balance Overall balance assessment: Independent;History of Falls                                          Cognition Arousal/Alertness: Awake/alert Behavior During Therapy: WFL for tasks assessed/performed;Impulsive Overall Cognitive Status: Within Functional Limits for tasks assessed                                 General Comments: impulsive with tasks, decreased safety awareness      Exercises  General Comments        Pertinent Vitals/Pain Pain Assessment: 0-10 Pain Score: 5  Pain Location: "all over" Pain Descriptors / Indicators: Aching;Sore Pain Intervention(s): Limited activity within patient's tolerance;Monitored during session;Ice applied    Home Living                      Prior Function            PT Goals (current goals can now be found in the care plan section) Progress towards PT goals: Progressing toward goals    Frequency    7X/week      PT Plan Current plan remains appropriate    Co-evaluation              AM-PAC PT "6 Clicks" Mobility   Outcome  Measure  Help needed turning from your back to your side while in a flat bed without using bedrails?: None Help needed moving from lying on your back to sitting on the side of a flat bed without using bedrails?: None Help needed moving to and from a bed to a chair (including a wheelchair)?: None Help needed standing up from a chair using your arms (e.g., wheelchair or bedside chair)?: None Help needed to walk in hospital room?: None Help needed climbing 3-5 steps with a railing? : None 6 Click Score: 24    End of Session Equipment Utilized During Treatment: Gait belt Activity Tolerance: Patient tolerated treatment well Patient left: with call bell/phone within reach;with chair alarm set;in chair Nurse Communication: Mobility status;Other (comment) Pain - Right/Left: Left Pain - part of body: Shoulder     Time: 1962-2297 PT Time Calculation (min) (ACUTE ONLY): 23 min  Charges:  $Gait Training: 8-22 mins $Therapeutic Activity: 8-22 mins                     Chesley Noon, PTA 01/05/19, 9:26 AM

## 2019-01-05 NOTE — TOC Initial Note (Signed)
Transition of Care [facility]) - Initial/Assessment Note    Patient Details  Name: Julie Wade MRN: [id number] Date of Birth: Sep 25, 1940  [facility] CM/SW Contact:    Shelbie Hutching, RN Phone Number: 01/05/2019, 9:59 AM  Clinical Narrative:                 Patient admitted for reverse left total shoulder arthroplasty.  Patient is ready for discharge today.  Patient reports that she lives with Julie Wade, significant other.  Patient will need home health PT and OT, aide also ordered.  Patient has no preference for home health provider but Medicare approved list provided for choice.  Julie Wade with [facility] has accepted the referral.  Julie Wade will come and pick the patient up around noon today.   Patient reports that she is independent and very active at baseline.    Expected Discharge Plan: [place] Barriers to Discharge: Barriers Resolved   Patient Goals and CMS Choice   CMS Medicare.gov Compare Post Acute Care list provided to:: Patient Choice offered to / list presented to : Patient  Expected Discharge Plan and Services Expected Discharge Plan: [place]   Discharge Planning Services: CM Consult Post Acute Care Choice: [place] arrangements for the past 2 months: Single Family Home Expected Discharge Date: 01/05/19                         HH Arranged: PT, OT, Nurse's Aide          Prior Living Arrangements/Services Living arrangements for the past 2 months: Single Family Home Lives with:: Significant Other Patient language and need for interpreter reviewed:: No Do you feel safe going back to the place where you live?: Yes      Need for Family Participation in Patient Care: Yes (Comment) Care giver support system in place?: Yes (comment)([place]- significant other)   Criminal Activity/Legal Involvement Pertinent to Current Situation/Hospitalization: No - Comment as needed  Activities of Daily Living Home  Assistive Devices/Equipment: None ADL Screening (condition at time of admission) Patient's cognitive ability adequate to safely complete daily activities?: Yes Is the patient deaf or have difficulty hearing?: No Does the patient have difficulty seeing, even when wearing glasses/contacts?: No Does the patient have difficulty concentrating, remembering, or making decisions?: No Patient able to express need for assistance with ADLs?: No Does the patient have difficulty dressing or bathing?: No Independently performs ADLs?: Yes (appropriate for developmental age) Does the patient have difficulty walking or climbing stairs?: No Weakness of Legs: None Weakness of Arms/Hands: None  Permission Sought/Granted Permission sought to share information with : Case Manager, Other (comment) Permission granted to share information with : Yes, Verbal Permission Granted     Permission granted to share info w AGENCY: Home Health Agency        Emotional Assessment Appearance:: Appears stated age Attitude/Demeanor/Rapport: Engaged Affect (typically observed): Accepting Orientation: : Oriented to Self, Oriented to Place, Oriented to  Time, Oriented to Situation Alcohol / Substance Use: Not Applicable Psych Involvement: No (comment)  Admission diagnosis:  ROTATOR CUFF TENDINITIS, LEFT. PRIMARY OSTEOARTHRITIS OF LEFT SHOULDER. NONTRAUMATIC INCOMPLETE TEAR OF LEFT ROTATOR CUFF. Patient Active Problem List   Diagnosis Date Noted  . Status post reverse arthroplasty of shoulder, left 01/03/2019   PCP:  Patient, No Pcp Per Pharmacy:   [id number] - [place], [place] - [address] [place] [address], [place]  [address] [address] [address] [place] [place] [id number] Phone: [phone] Fax: [phone]     Social Determinants of Health (SDOH) Interventions    Readmission Risk Interventions No flowsheet data found.

## 2019-01-07 LAB — SURGICAL PATHOLOGY

## 2020-01-15 ENCOUNTER — Other Ambulatory Visit: Payer: Self-pay | Admitting: Orthopedic Surgery

## 2020-01-15 DIAGNOSIS — G8929 Other chronic pain: Secondary | ICD-10-CM

## 2020-01-15 DIAGNOSIS — S32010A Wedge compression fracture of first lumbar vertebra, initial encounter for closed fracture: Secondary | ICD-10-CM

## 2020-01-15 DIAGNOSIS — M5136 Other intervertebral disc degeneration, lumbar region: Secondary | ICD-10-CM

## 2020-02-06 ENCOUNTER — Ambulatory Visit
Admission: RE | Admit: 2020-02-06 | Discharge: 2020-02-06 | Disposition: A | Discharge status: Home / Self Care | Payer: Medicare HMO | Source: Ambulatory Visit | Attending: Orthopedic Surgery | Admitting: Orthopedic Surgery

## 2020-02-06 ENCOUNTER — Other Ambulatory Visit: Payer: Self-pay

## 2020-02-06 DIAGNOSIS — M5136 Other intervertebral disc degeneration, lumbar region: Secondary | ICD-10-CM | POA: Insufficient documentation

## 2020-02-06 DIAGNOSIS — G8929 Other chronic pain: Secondary | ICD-10-CM | POA: Diagnosis present

## 2020-02-06 DIAGNOSIS — M545 Low back pain, unspecified: Secondary | ICD-10-CM

## 2020-02-06 DIAGNOSIS — S32010A Wedge compression fracture of first lumbar vertebra, initial encounter for closed fracture: Secondary | ICD-10-CM | POA: Diagnosis present

## 2020-08-17 ENCOUNTER — Other Ambulatory Visit: Payer: Self-pay | Admitting: Physical Medicine & Rehabilitation

## 2020-08-17 DIAGNOSIS — M5414 Radiculopathy, thoracic region: Secondary | ICD-10-CM

## 2020-08-28 ENCOUNTER — Other Ambulatory Visit: Payer: Self-pay

## 2020-08-28 ENCOUNTER — Ambulatory Visit
Admission: RE | Admit: 2020-08-28 | Discharge: 2020-08-28 | Disposition: A | Discharge status: Home / Self Care | Payer: Medicare HMO | Source: Ambulatory Visit | Attending: Physical Medicine & Rehabilitation | Admitting: Physical Medicine & Rehabilitation

## 2020-08-28 DIAGNOSIS — M5414 Radiculopathy, thoracic region: Secondary | ICD-10-CM | POA: Insufficient documentation

## 2021-11-13 IMAGING — MR MR THORACIC SPINE W/O CM
6 series · 30 of 48 positions shown · non-contrast
Comparison: None.

CLINICAL DATA: Thoracic radiculitis.

EXAM:
MRI THORACIC SPINE WITHOUT CONTRAST
TECHNIQUE: Multiplanar, multisequence MR imaging of the thoracic spine was
performed. No intravenous contrast was administered.

[Series 16: T1 · sagittal · 5.0mm · 1.88mm/px · 2 of 9 slices shown (1 of 2)]
[im 1/9]
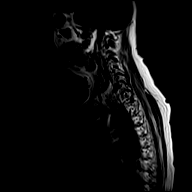
[im 9/9]
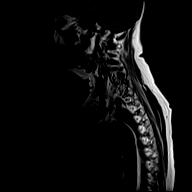

[Series 19: T2 · sagittal · 3.0mm · 1.06mm/px · 6 of 17 slices shown (1 of 2)]
[im 1/17]
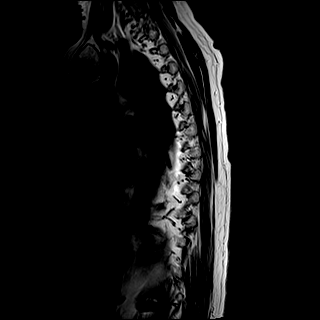
[im 4/17]
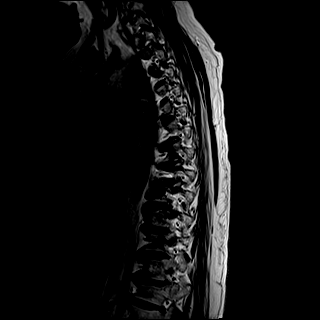
[im 7/17]
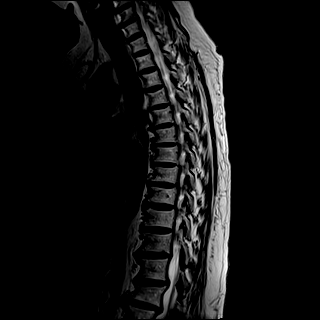
[im 10/17]
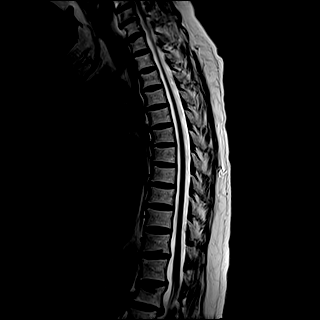
[im 13/17]
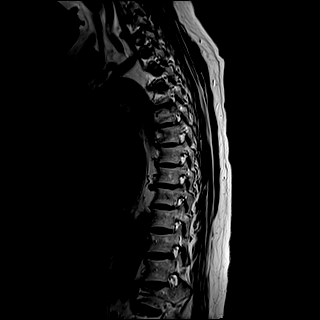
[im 17/17]
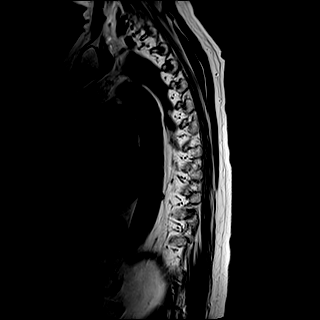

[Series 20: T1 · sagittal · 3.0mm · 1.06mm/px · 6 of 17 slices shown (2 of 2)]
[im 1/17]
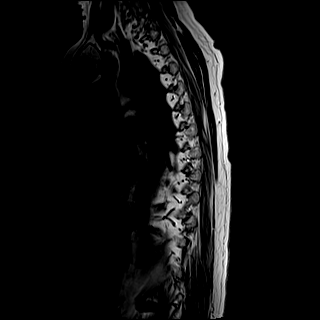
[im 4/17]
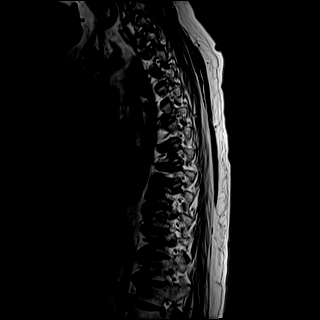
[im 7/17]
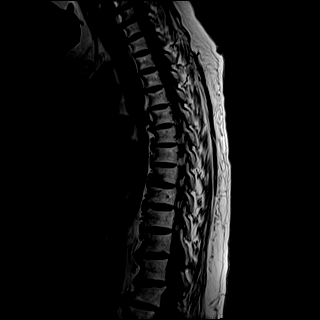
[im 10/17]
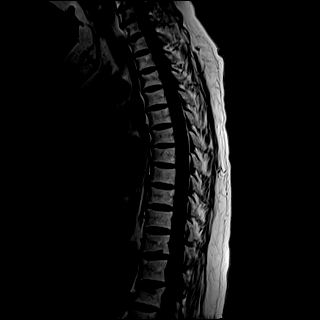
[im 13/17]
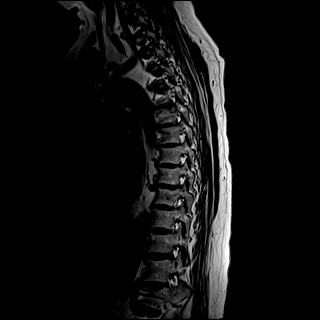
[im 17/17]
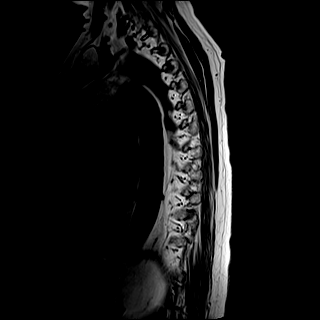

[Series 21: STIR · sagittal · 3.0mm · 0.53mm/px · 6 of 17 slices shown]
[im 1/17]
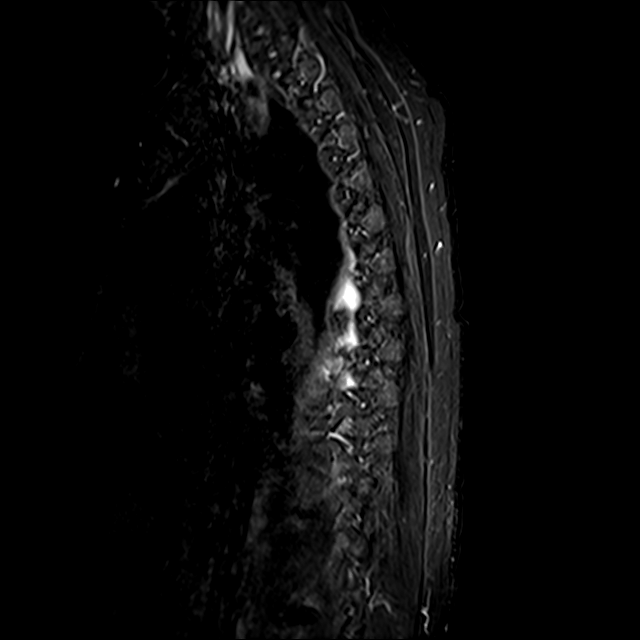
[im 4/17]
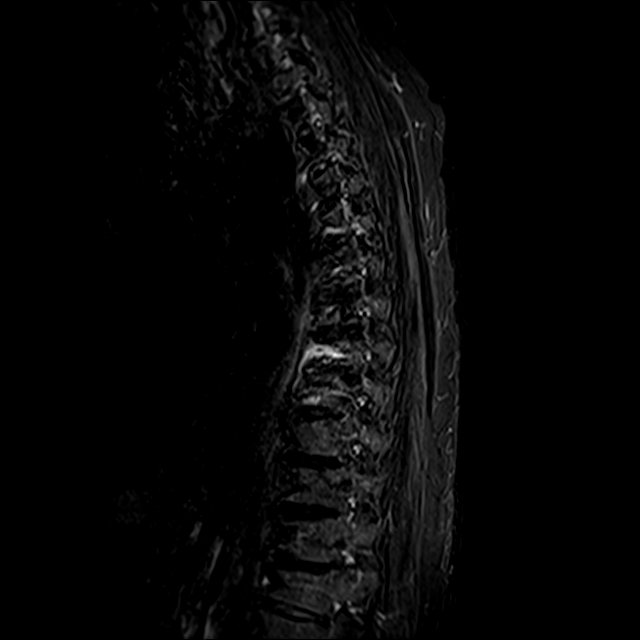
[im 7/17]
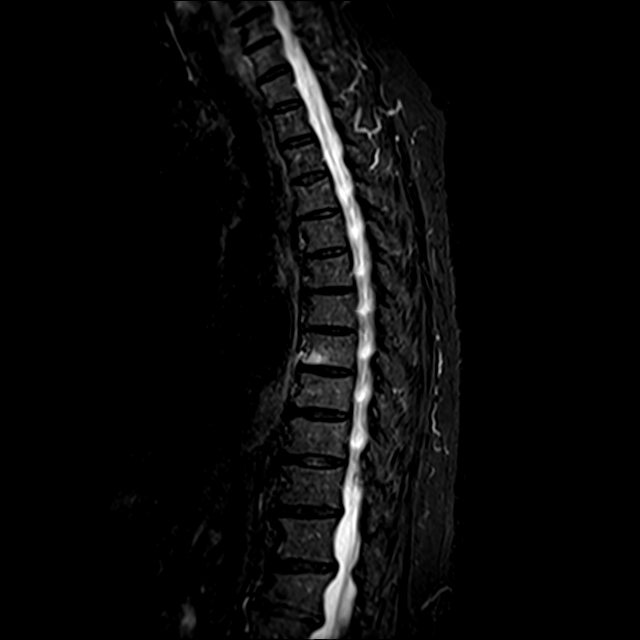
[im 10/17]
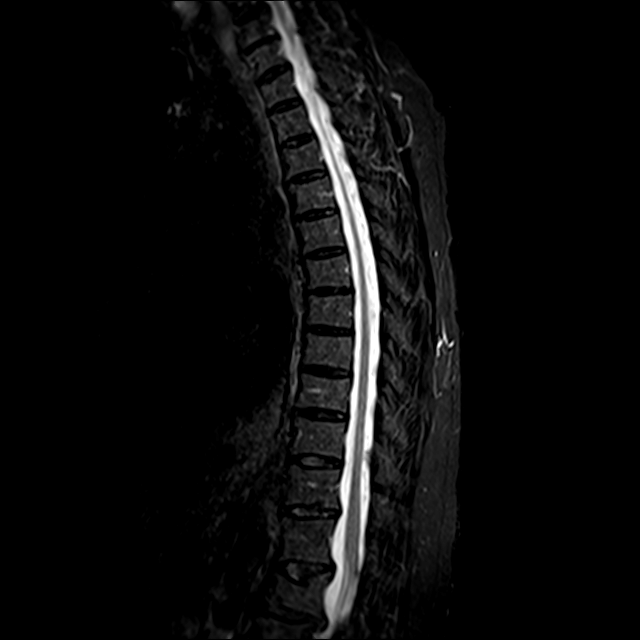
[im 13/17]
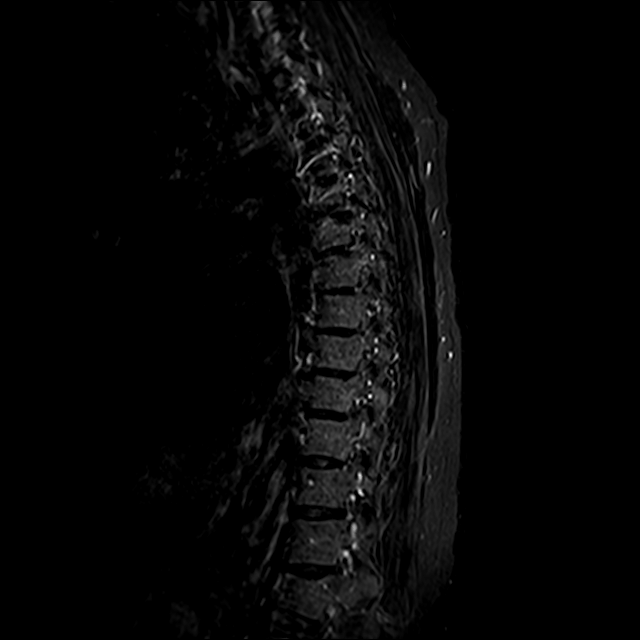
[im 17/17]
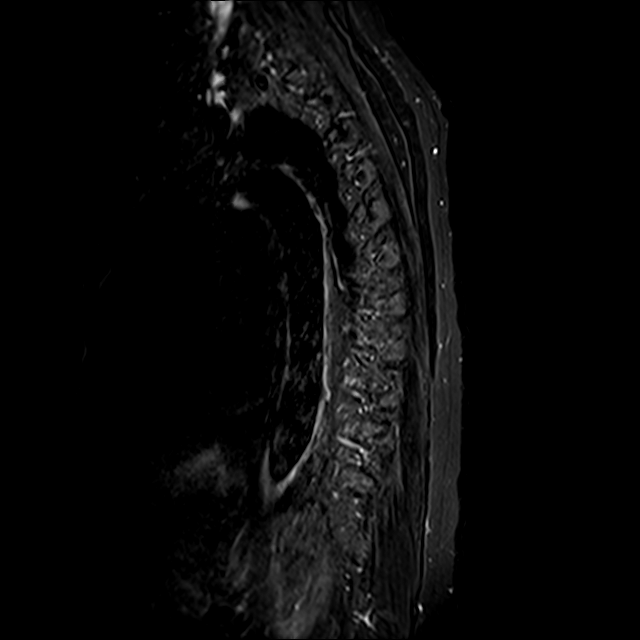

[Series 22: T2 · axial · 4.0mm · 0.59mm/px · z∈[-357,-113]mm · 8 of 43 slices shown (2 of 2)]
[im 1/43]
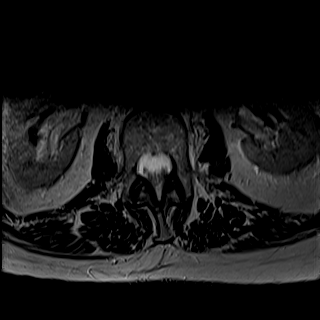
[im 7/43]
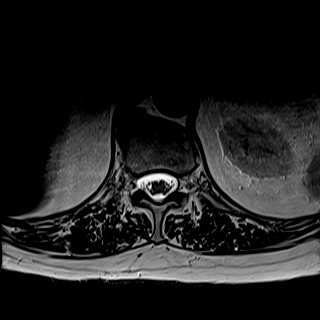
[im 13/43]
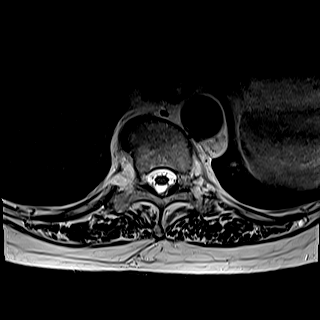
[im 20/43]
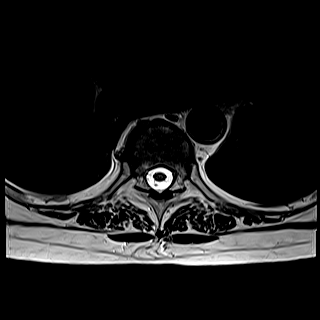
[im 23/43]
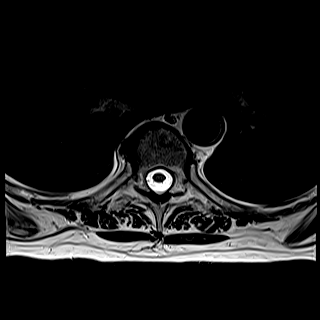
[im 30/43]
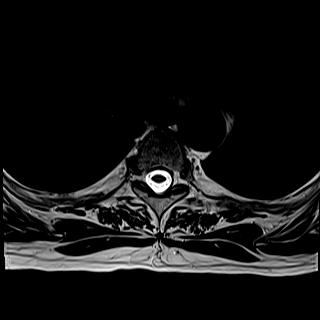
[im 36/43]
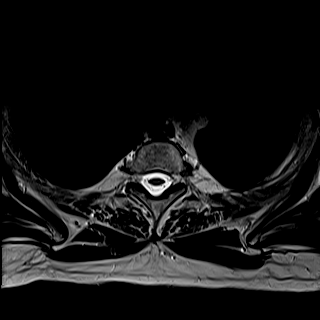
[im 43/43]
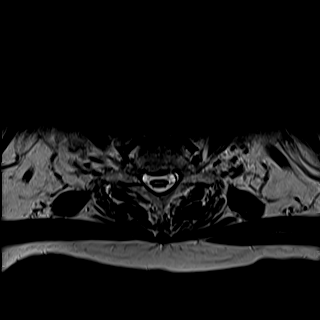

[Series 23: GRE · axial · 4.0mm · 0.37mm/px · z∈[-357,-321]mm · 2 of 43 slices shown]
[im 1/43]
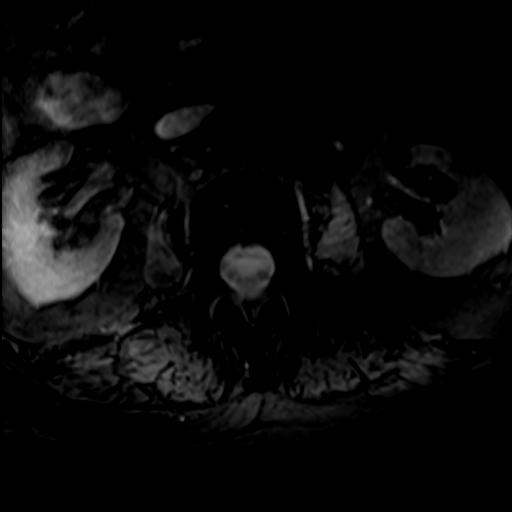
[im 7/43]
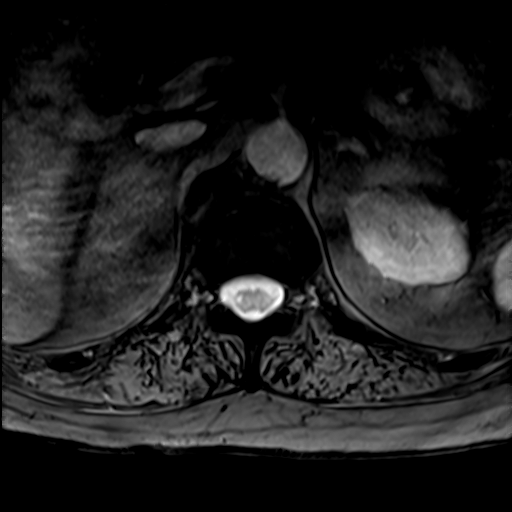

[30 of 48 positions shown; findings below may reference images not displayed]

FINDINGS: Alignment:  Physiologic.

Vertebrae: No acute fracture, evidence of discitis, or bone lesion.
Chronic compression fracture of the L1 vertebral body without marrow
edema or significant retropulsion.

Cord:  Normal signal and morphology.

Paraspinal and other soft tissues: Negative.

Disc levels:

No significant disc herniation, spinal canal or neural foraminal
stenosis at any thoracic level.
IMPRESSION: 1. No acute abnormality of the thoracic spine. No significant spinal
canal or neural foraminal stenosis.
2. Chronic compression fracture of L1.
